# Patient Record
Sex: Female | Born: 2000 | Race: Black or African American | Hispanic: No | Marital: Single | Smoking: Never smoker
Health system: Southern US, Community
[De-identification: ages and names within clinical notes are randomized; demographics above are authoritative.]

## PROBLEM LIST (undated history)

## (undated) DIAGNOSIS — D573 Sickle-cell trait: Secondary | ICD-10-CM

## (undated) DIAGNOSIS — E119 Type 2 diabetes mellitus without complications: Secondary | ICD-10-CM

## (undated) DIAGNOSIS — N390 Urinary tract infection, site not specified: Secondary | ICD-10-CM

## (undated) DIAGNOSIS — E1110 Type 2 diabetes mellitus with ketoacidosis without coma: Secondary | ICD-10-CM

## (undated) HISTORY — PX: ADENOIDECTOMY: SUR15

## (undated) HISTORY — PX: MYRINGOTOMY: SHX2060

## (undated) HISTORY — PX: TONSILLECTOMY: SUR1361

---
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
## 2022-04-24 ENCOUNTER — Other Ambulatory Visit: Payer: Self-pay

## 2022-04-24 ENCOUNTER — Emergency Department
Admission: EM | Admit: 2022-04-24 | Discharge: 2022-04-24 | Disposition: A | Discharge status: Home / Self Care | Payer: Medicaid Other | Attending: Emergency Medicine | Admitting: Emergency Medicine

## 2022-04-24 ENCOUNTER — Emergency Department: Payer: Medicaid Other

## 2022-04-24 DIAGNOSIS — E876 Hypokalemia: Secondary | ICD-10-CM | POA: Insufficient documentation

## 2022-04-24 DIAGNOSIS — R112 Nausea with vomiting, unspecified: Secondary | ICD-10-CM | POA: Diagnosis not present

## 2022-04-24 DIAGNOSIS — R102 Pelvic and perineal pain: Secondary | ICD-10-CM

## 2022-04-24 DIAGNOSIS — E7132 Disorders of ketone metabolism: Secondary | ICD-10-CM | POA: Diagnosis not present

## 2022-04-24 DIAGNOSIS — R824 Acetonuria: Secondary | ICD-10-CM | POA: Diagnosis not present

## 2022-04-24 DIAGNOSIS — R809 Proteinuria, unspecified: Secondary | ICD-10-CM | POA: Diagnosis not present

## 2022-04-24 DIAGNOSIS — R1030 Lower abdominal pain, unspecified: Secondary | ICD-10-CM | POA: Diagnosis present

## 2022-04-24 LAB — LIPASE, BLOOD: Lipase: 28 U/L (ref 11–51)

## 2022-04-24 LAB — BLOOD GAS, VENOUS
Acid-Base Excess: 0.3 mmol/L (ref 0.0–2.0)
Bicarbonate: 25.4 mmol/L (ref 20.0–28.0)
O2 Saturation: 83.5 %
Patient temperature: 37
pCO2, Ven: 42 mmHg — ABNORMAL LOW (ref 44–60)
pH, Ven: 7.39 (ref 7.25–7.43)
pO2, Ven: 54 mmHg — ABNORMAL HIGH (ref 32–45)

## 2022-04-24 LAB — COMPREHENSIVE METABOLIC PANEL
ALT: 19 U/L (ref 0–44)
AST: 23 U/L (ref 15–41)
Albumin: 4.6 g/dL (ref 3.5–5.0)
Alkaline Phosphatase: 85 U/L (ref 38–126)
Anion gap: 14 (ref 5–15)
BUN: 18 mg/dL (ref 6–20)
CO2: 18 mmol/L — ABNORMAL LOW (ref 22–32)
Calcium: 10.1 mg/dL (ref 8.9–10.3)
Chloride: 103 mmol/L (ref 98–111)
Creatinine, Ser: 0.53 mg/dL (ref 0.44–1.00)
GFR, Estimated: >60 mL/min (ref >60)
Glucose, Bld: 330 mg/dL — ABNORMAL HIGH (ref 70–99)
Potassium: 3.1 mmol/L — ABNORMAL LOW (ref 3.5–5.1)
Sodium: 135 mmol/L (ref 135–145)
Total Bilirubin: 1 mg/dL (ref 0.3–1.2)
Total Protein: 9.1 g/dL — ABNORMAL HIGH (ref 6.5–8.1)

## 2022-04-24 LAB — URINALYSIS, ROUTINE W REFLEX MICROSCOPIC
Bilirubin Urine: NEGATIVE
Glucose, UA: >=500 mg/dL — AB
Ketones, ur: 80 mg/dL — AB
Nitrite: NEGATIVE
Protein, ur: 30 mg/dL — AB
Specific Gravity, Urine: 1.024 (ref 1.005–1.030)
pH: 9 — ABNORMAL HIGH (ref 5.0–8.0)

## 2022-04-24 LAB — CBC
HCT: 36.5 % (ref 36.0–46.0)
Hemoglobin: 11 g/dL — ABNORMAL LOW (ref 12.0–15.0)
MCH: 19 pg — ABNORMAL LOW (ref 26.0–34.0)
MCHC: 30.1 g/dL (ref 30.0–36.0)
MCV: 63.1 fL — ABNORMAL LOW (ref 80.0–100.0)
Platelets: 449 10*3/uL — ABNORMAL HIGH (ref 150–400)
RBC: 5.78 MIL/uL — ABNORMAL HIGH (ref 3.87–5.11)
RDW: 18.5 % — ABNORMAL HIGH (ref 11.5–15.5)
WBC: 10.2 10*3/uL (ref 4.0–10.5)
nRBC: 0 % (ref 0.0–0.2)

## 2022-04-24 LAB — LACTIC ACID, PLASMA: Lactic Acid, Venous: 0.9 mmol/L (ref 0.5–1.9)

## 2022-04-24 LAB — BETA-HYDROXYBUTYRIC ACID: Beta-Hydroxybutyric Acid: 1.82 mmol/L — ABNORMAL HIGH (ref 0.05–0.27)

## 2022-04-24 LAB — POC URINE PREG, ED: Preg Test, Ur: NEGATIVE

## 2022-04-24 LAB — CBG MONITORING, ED: Glucose-Capillary: 122 mg/dL — ABNORMAL HIGH (ref 70–99)

## 2022-04-24 LAB — MAGNESIUM: Magnesium: 1.7 mg/dL (ref 1.7–2.4)

## 2022-04-24 MED ORDER — SODIUM CHLORIDE 0.9 % IV BOLUS
1000.0000 mL | Freq: Once | INTRAVENOUS | Status: AC
  Administered 2022-04-24: 1000 mL via INTRAVENOUS

## 2022-04-24 MED ORDER — POTASSIUM CHLORIDE CRYS ER 20 MEQ PO TBCR: 40.0000 meq | EXTENDED_RELEASE_TABLET | Freq: Once | ORAL | Status: DC

## 2022-04-24 MED ORDER — ONDANSETRON 4 MG PO TBDP
4.0000 mg | ORAL_TABLET | Freq: Once | ORAL | Status: AC | PRN
Start: 2022-04-24 — End: 2022-04-24
  Administered 2022-04-24: 4 mg via ORAL
  Filled 2022-04-24: qty 1

## 2022-04-24 MED ORDER — HYDROMORPHONE HCL 1 MG/ML IJ SOLN
1.0000 mg | Freq: Once | INTRAMUSCULAR | Status: AC
  Administered 2022-04-24: 1 mg via INTRAVENOUS
  Filled 2022-04-24: qty 1

## 2022-04-24 NOTE — ED Provider Notes (Signed)
[facility] Provider Note    Event Date/Time   First MD Initiated Contact with Patient 04/24/22 1714     (approximate)   History   Chief Complaint Abdominal Cramping   HPI Krista Gill is a 21 y.o. female, history of type 1 diabetes, DKA, presents emergency department for evaluation of abdominal cramping.  She states that she started her period approximately 3 hours ago has been having lower abdominal cramps, that are reportedly triggering her to vomit.  She states that this is happened before with her menstrual cycle cramps, but never this bad.  She states that she has some vaginal bleeding, though it is the normal amount that she usually gets with her periods. She additionally has a history of DKA and was concerned about her blood sugar.  She currently takes [place].  She states that she has not missed any dosages.  Denies fever/chills, vaginal discharge, chest pain, shortness of breath, diarrhea, rash/lesions, numbness/tingling upper or lower extremities, dysuria, or dizziness/lightheadedness.  Denies any concern for STDs.  History Limitations: No limitations.        Physical Exam  Triage Vital Signs: ED Triage Vitals  Enc Vitals Group     BP 04/24/22 1618 122/86     Pulse Rate 04/24/22 1618 84     Resp 04/24/22 1618 16     Temp 04/24/22 1618 97.9 F (36.6 C)     Temp Source 04/24/22 1618 Oral     SpO2 04/24/22 1618 100 %     Weight 04/24/22 1621 150 lb (68 kg)     Height 04/24/22 1621 4\' 11"  (1.499 m)     Head Circumference --      Peak Flow --      Pain Score 04/24/22 1620 7     Pain Loc --      Pain Edu? --      Excl. in GC? --     Most recent vital signs: Vitals:   04/24/22 1618  BP: 122/86  Pulse: 84  Resp: 16  Temp: 97.9 F (36.6 C)  SpO2: 100%    General: Awake, NAD.  Skin: Warm, dry. No rashes or lesions.  Eyes: PERRL. Conjunctivae normal.  CV: Good peripheral perfusion.  Resp: Normal effort.  Abd: Soft,  non-tender. No distention.  Neuro: At baseline. No gross neurological deficits.   Focused Exam: Mild tenderness diffusely across the lower abdomen.  No obvious adnexal masses.  Physical Exam    ED Results / Procedures / Treatments  Labs (all labs ordered are listed, but only abnormal results are displayed) Labs Reviewed  COMPREHENSIVE METABOLIC PANEL - Abnormal; Notable for the following components:      Result Value   Potassium 3.1 (*)    CO2 18 (*)    Glucose, Bld 330 (*)    Total Protein 9.1 (*)    All other components within normal limits  CBC - Abnormal; Notable for the following components:   RBC 5.78 (*)    Hemoglobin 11.0 (*)    MCV 63.1 (*)    MCH 19.0 (*)    RDW 18.5 (*)    Platelets 449 (*)    All other components within normal limits  URINALYSIS, ROUTINE W REFLEX MICROSCOPIC - Abnormal; Notable for the following components:   Color, Urine YELLOW (*)    APPearance CLEAR (*)    pH 9.0 (*)    Glucose, UA >=500 (*)    Hgb urine dipstick LARGE (*)  Ketones, ur 80 (*)    Protein, ur 30 (*)    Leukocytes,Ua SMALL (*)    Bacteria, UA RARE (*)    All other components within normal limits  BETA-HYDROXYBUTYRIC ACID - Abnormal; Notable for the following components:   Beta-Hydroxybutyric Acid 1.82 (*)    All other components within normal limits  LIPASE, BLOOD  MAGNESIUM  LACTIC ACID, PLASMA  BLOOD GAS, VENOUS  LACTIC ACID, PLASMA  POC URINE PREG, ED     EKG N/A.   RADIOLOGY  ED Provider Interpretation: I personally viewed and interpreted this ultrasound.  No evidence of any acute findings.  US PELVIC COMPLETE W TRANSVAGINAL AND TORSION R/O  Result Date: 04/24/2022 CLINICAL DATA:  Pelvic pain for several hours EXAM: TRANSABDOMINAL AND TRANSVAGINAL ULTRASOUND OF PELVIS DOPPLER ULTRASOUND OF OVARIES TECHNIQUE: Both transabdominal and transvaginal ultrasound examinations of the pelvis were performed. Transabdominal technique was performed for global imaging  of the pelvis including uterus, ovaries, adnexal regions, and pelvic cul-de-sac. It was necessary to proceed with endovaginal exam following the transabdominal exam to visualize the ovaries. Color and duplex Doppler ultrasound was utilized to evaluate blood flow to the ovaries. COMPARISON:  None Available. FINDINGS: Uterus Measurements: 7.1 x 3.3 x 4.2 cm. = volume: 51 mL. No fibroids or other mass visualized. Endometrium Thickness: 4.4 mm.  No focal abnormality visualized. Right ovary Measurements: 3.9 x 2.0 x 1.9 cm. = volume: 7.6 mL. Normal appearance/no adnexal mass. Left ovary Measurements: 3.6 x 2.0 x 3.3 cm. = volume: 12.5 mL. Normal appearance/no adnexal mass. Pulsed Doppler evaluation of both ovaries demonstrates normal low-resistance arterial and venous waveforms. Other findings Minimal free fluid is noted likely physiologic in nature. IMPRESSION: Unremarkable pelvic ultrasound.  No ovarian torsion is seen. Electronically Signed   By: Alcide Clever M.D.   On: 04/24/2022 19:58    PROCEDURES:  Critical Care performed: N/A.  Procedures    MEDICATIONS ORDERED IN ED: Medications  potassium chloride SA (KLOR-CON M) CR tablet 40 mEq (0 mEq Oral Hold 04/24/22 1800)  ondansetron (ZOFRAN-ODT) disintegrating tablet 4 mg (4 mg Oral Given 04/24/22 1629)  sodium chloride 0.9 % bolus 1,000 mL (0 mLs Intravenous Stopped 04/24/22 2001)  HYDROmorphone (DILAUDID) injection 1 mg (1 mg Intravenous Given 04/24/22 1801)     IMPRESSION / MDM / ASSESSMENT AND PLAN / ED COURSE  I reviewed the triage vital signs and the nursing notes.                              Differential diagnosis includes, but is not limited to, cystitis, pyelonephritis, menstrual cramps, ovarian cyst, ovarian torsion, DKA  ED Course Patient appears clinically well, but uncomfortable.  Vitals are within normal limits.  We will go ahead treat with IV fluids, ondansetron, and hydromorphone.  CBC shows no leukocytosis or clinically  significant anemia.  CMP shows hypokalemia at 3.1.  We will provide potassium supplementation.  Additionally shows elevated glucose at 330.  No transaminitis.  Lipase unremarkable at 28.  Unlikely pancreatitis.  Beta hydroxybutyrate acid elevated at 1.82.  Urinalysis shows elevated glucose, large hemoglobin, ketonuria, and proteinuria.  No significant signs of infection.  Magnesium level unremarkable at 1.7.   Assessment/Plan Patient presents with lower abdominal cramping with associated nausea/vomiting.  Physical exam is unimpressive.  Clinically she appears well.  Ultrasound shows no evidence of ovarian cyst/torsion.  Her lab work-up is notable for elevated beta hydroxybutyric acid at 1.82, serum glucose  of 330.  Symptoms suggestive of possible DKA.  Will get venous blood gas to evaluate further.  Patient care transferred over to [facility] would PA-C at 2045.    FINAL CLINICAL IMPRESSION(S) / ED DIAGNOSES   Final diagnoses:  None     Rx / DC Orders   ED Discharge Orders     None        Note:  This document was prepared using Dragon voice recognition software and may include unintentional dictation errors.   Varney Daily, [place] 04/24/22 2046    Concha Se, MD 04/25/22 1143

## 2022-04-24 NOTE — ED Triage Notes (Addendum)
Pt reports that she started her period 3 hours ago and having cramps and states that she thinks the cramps are triggering her to vomit, pt states that this has [place], states that when she checked her blood sugar today it was 206, pt states that when the fire dept checked her blood sugar it was 360, but thinks that ems wrote it down as 306

## 2022-04-24 NOTE — Discharge Instructions (Signed)
Please make follow-up appointment with Dr. Jackson. 

## 2022-04-24 NOTE — ED Notes (Signed)
This RN to bedside for blood redraw, updated pt. And mother on POC.

## 2022-04-24 NOTE — ED Provider Notes (Signed)
  Physical Exam  BP 108/69 (BP Location: Right Arm)   Pulse 84   Temp 98.2 F (36.8 C) (Oral)   Resp 16   Ht 4\' 11"  (1.499 m)   Wt 68 kg   SpO2 98%   BMI 30.30 kg/m   Physical Exam  Procedures  Procedures  ED Course / MDM   Clinical Course as of 04/24/22 2150  Wed Apr 24, 2022  2038 Beta-Hydroxybutyric Acid(!): 1.82 [JW]    Clinical Course User Index [JW] Apr 26, 2022, PA-C   Medical Decision Making Amount and/or Complexity of Data Reviewed Labs: ordered. Decision-making details documented in ED Course. Radiology: ordered.  Risk Prescription drug management.    Assumed patient care from [facility], PA-C.  Patient's repeat CBG was 122.  Upon recheck, patient was resting comfortably and stated that she had no pelvic pain and no nausea/vomiting.  VBG returned with reassuring venous pH with only mildly decreased PCO2.  Patient states that she feels well enough to be discharged and did request gynecology referral.  Referral to Dr. [facility] [place] was included in patient's discharge paperwork.     Jean Rosenthal Momeyer, [place] 04/24/22 2151    2152, MD 04/24/22 2252

## 2022-04-24 NOTE — ED Triage Notes (Signed)
First Nurse Note:  Pt via EMS from home. Pt c/o menstrual cramps that is unchanged from her normal cramps. States her cycle started today. Pt is A&Ox4 and NAD  88 HR 126/77 98% on RA 306 CBG with hx

## 2022-04-24 NOTE — ED Notes (Signed)
This RN to bedside for blood draw, updated mother and pt. On POC.

## 2022-05-15 ENCOUNTER — Other Ambulatory Visit: Payer: Self-pay

## 2022-05-15 ENCOUNTER — Encounter: Payer: Self-pay | Admitting: Emergency Medicine

## 2022-05-15 ENCOUNTER — Emergency Department
Admission: EM | Admit: 2022-05-15 | Discharge: 2022-05-15 | Disposition: A | Discharge status: Home / Self Care | Payer: Medicaid Other | Attending: Emergency Medicine | Admitting: Emergency Medicine

## 2022-05-15 DIAGNOSIS — W1839XA Other fall on same level, initial encounter: Secondary | ICD-10-CM | POA: Diagnosis not present

## 2022-05-15 DIAGNOSIS — S51812A Laceration without foreign body of left forearm, initial encounter: Secondary | ICD-10-CM | POA: Insufficient documentation

## 2022-05-15 DIAGNOSIS — E109 Type 1 diabetes mellitus without complications: Secondary | ICD-10-CM | POA: Diagnosis not present

## 2022-05-15 DIAGNOSIS — S59912A Unspecified injury of left forearm, initial encounter: Secondary | ICD-10-CM | POA: Diagnosis present

## 2022-05-15 MED ORDER — CEPHALEXIN 500 MG PO CAPS: 1000.0000 mg | ORAL_CAPSULE | Freq: Two times a day (BID) | ORAL | 0 refills | Status: AC

## 2022-05-15 MED ORDER — LIDOCAINE-EPINEPHRINE 2 %-1:200000 IJ SOLN
10.0000 mL | Freq: Once | INTRAMUSCULAR | Status: AC
Start: 2022-05-15 — End: 2022-05-15
  Administered 2022-05-15: 10 mL
  Filled 2022-05-15: qty 20

## 2022-05-15 NOTE — Discharge Instructions (Addendum)
-  Please return to the emergency department or urgent care for suture removal in 7 to 10 days.  -Take all of your antibiotics as prescribed.  -Recommend applying a daily antibiotic ointment to the suture site daily.  You do not have to keep it covered, however cover is recommended if you plan on being outside.  -Avoid any exertional activities with that arm to prevent suture rupture.  -Return to the emergency department anytime if you begin to experience any new or worsening symptoms.

## 2022-05-15 NOTE — ED Provider Triage Note (Signed)
Emergency Medicine Provider Triage Evaluation Note  ROSMARIE ESQUIBEL , a 21 y.o. female  was evaluated in triage.  Pt complains of laceration to the left forearm.  Tdap up-to-date.  Review of Systems  Positive: Laceration left forearm Negative: Numbness tingling  Physical Exam  BP 114/84 (BP Location: Left Arm)   Pulse (!) 105   Resp 16   Ht 4\' 11"  (1.499 m)   Wt 72.6 kg   SpO2 97%   BMI 32.32 kg/m  Gen:   Awake, no distress   Resp:  Normal effort  MSK:   Moves extremities without difficulty, 6 inch laceration noted to the left forearm Other:    Medical Decision Making  Medically screening exam initiated at 12:23 PM.  Appropriate orders placed.  BECKEY POLKOWSKI was informed that the remainder of the evaluation will be completed by another provider, this initial triage assessment does not replace that evaluation, and the importance of remaining in the ED until their evaluation is complete.     Irwin Brakeman, PA-C 05/15/22 1223

## 2022-05-15 NOTE — ED Notes (Signed)
Lac to left arm.  Larey Seat outside.  On concreted.  No other injury.  Says she fell because either she was going to fast or what.

## 2022-05-15 NOTE — ED Provider Notes (Signed)
[facility] Provider Note    Event Date/Time   First MD Initiated Contact with Patient 05/15/22 1244     (approximate)   History   Chief Complaint Laceration   HPI Krista Gill is a 21 y.o. female, history of type 1 diabetes, sickle cell trait, presents to the emergency department for evaluation of forearm laceration.  Patient states that she fell down and sustained a laceration on her left forearm somehow.  She is unsure exactly what had cut it.  Denies any other injuries or illnesses.  Denies any head injury or LOC.  She washed it out immediately with cold water.  Denies cold sensation in the affected extremity, numbness/tingling in the affected extremity, chest pain, shortness of breath, fever/chills, myalgias, nausea/vomiting, or dizziness/lightheadedness.  History Limitations: No limitations.        Physical Exam  Triage Vital Signs: ED Triage Vitals  Enc Vitals Group     BP 05/15/22 1222 114/84     Pulse Rate 05/15/22 1222 (!) 105     Resp 05/15/22 1222 16     Temp 05/15/22 1223 98.4 F (36.9 C)     Temp Source 05/15/22 1222 Oral     SpO2 05/15/22 1222 97 %     Weight 05/15/22 1222 160 lb (72.6 kg)     Height 05/15/22 1222 4\' 11"  (1.499 m)     Head Circumference --      Peak Flow --      Pain Score 05/15/22 1222 3     Pain Loc --      Pain Edu? --      Excl. in GC? --     Most recent vital signs: Vitals:   05/15/22 1223 05/15/22 1434  BP:  114/80  Pulse:  99  Resp:  16  Temp: 98.4 F (36.9 C) 98.4 F (36.9 C)  SpO2:  98%    General: Awake, NAD.  Skin: Warm, dry. No rashes or lesions.  Eyes: PERRL. Conjunctivae normal.  CV: Good peripheral perfusion.  Resp: Normal effort.  Abd: Soft, non-tender. No distention.  Neuro: At baseline. No gross neurological deficits.  Musculoskeletal: Normal ROM of all extremities.   Focused Exam: 8 cm linear laceration across the left forearm with exposed subcutaneous tissue.  No active  bleeding or discharge.  No foreign bodies.  Normal range of motion of the arm and hand/fingers distally.  Sensation intact distally.  No surrounding warmth, erythema, or tenderness.  No underlying osseous tenderness.  Physical Exam    ED Results / Procedures / Treatments  Labs (all labs ordered are listed, but only abnormal results are displayed) Labs Reviewed - No data to display   EKG N/A.   RADIOLOGY  ED Provider Interpretation: N/A.  No results found.  PROCEDURES:  Critical Care performed: N/A.  07/15/22.Laceration Repair  Date/Time: 05/15/2022 3:30 PM  Performed by: 07/15/2022, PA Authorized by: Varney Daily, PA   Consent:    Consent obtained:  Verbal   Consent given by:  Patient   Risks, benefits, and alternatives were discussed: yes     Risks discussed:  Infection, pain, retained foreign body, vascular damage and tendon damage   Alternatives discussed:  No treatment Universal protocol:    Patient identity confirmed:  Verbally with patient Anesthesia:    Anesthesia method:  Local infiltration   Local anesthetic:  Lidocaine 2% WITH epi Laceration details:    Location:  Shoulder/arm   Shoulder/arm location:  L  lower arm   Length (cm):  8   Depth (mm):  3 Pre-procedure details:    Preparation:  Patient was prepped and draped in usual sterile fashion Exploration:    Hemostasis achieved with:  Direct pressure   Wound exploration: wound explored through full range of motion and entire depth of wound visualized     Wound extent: no foreign bodies/material noted, no muscle damage noted, no nerve damage noted, no tendon damage noted, no underlying fracture noted and no vascular damage noted   Treatment:    Area cleansed with:  Saline   Amount of cleaning:  Extensive   Irrigation solution:  Sterile saline   Irrigation volume:  1000 ml   Irrigation method:  Pressure wash   Debridement:  None Skin repair:    Repair method:  Sutures   Suture size:   4-0   Suture material:  Nylon   Suture technique:  Simple interrupted and running   Number of sutures:  20 Approximation:    Approximation:  Close Repair type:    Repair type:  Simple Post-procedure details:    Dressing:  Sterile dressing and tube gauze   Procedure completion:  Tolerated well, no immediate complications     MEDICATIONS ORDERED IN ED: Medications  lidocaine-EPINEPHrine (XYLOCAINE W/EPI) 2 %-1:200000 (PF) injection 10 mL (10 mLs Infiltration Given 05/15/22 1400)     IMPRESSION / MDM / ASSESSMENT AND PLAN / ED COURSE  I reviewed the triage vital signs and the nursing notes.                              Differential diagnosis includes, but is not limited to, laceration, foreign body, cellulitis, tendon injury.   Assessment/Plan Patient presents with a 8 cm linear vertical laceration across the left forearm.  No active bleeding or discharge at this time.  She is up-to-date on her tetanus.  No signs of any underlying tendon injury or osseous injury.  Was able to repair with a combination of simple running and simple interrupted sutures.  See above for details.  She tolerated the procedure well with no immediate complications.  Given the nature of the injury, will provide her with antimicrobial prophylaxis with prescription for cephalexin.  Instructed her to return to the emergency department 7 to 10 days for suture removal.  She expressed understanding and agreed.  With discharge.  Provided the patient with anticipatory guidance, return precautions, and educational material. Encouraged the patient to return to the emergency department at any time if they begin to experience any new or worsening symptoms. Patient expressed understanding and agreed with the plan.   Patient's presentation is most consistent with acute, uncomplicated illness.       FINAL CLINICAL IMPRESSION(S) / ED DIAGNOSES   Final diagnoses:  Laceration of left forearm, initial encounter     Rx /  DC Orders   ED Discharge Orders          Ordered    cephALEXin (KEFLEX) 500 MG capsule  2 times daily        05/15/22 1423             Note:  This document was prepared using Dragon voice recognition software and may include unintentional dictation errors.   Varney Daily, [place] 05/15/22 1532    Concha Se, MD 05/16/22 [phone]

## 2022-05-15 NOTE — ED Triage Notes (Signed)
Pt to ED via POV for laceration on her left forearm. Pt states that she is unsure how it happened. Bleeding is controlled at this time.

## 2022-05-15 NOTE — ED Notes (Signed)
Dc instructions reviewed with pt no questions or concerns at this time. Will have sutures removed in 7-10 days

## 2022-05-22 ENCOUNTER — Emergency Department
Admission: EM | Admit: 2022-05-22 | Discharge: 2022-05-22 | Disposition: A | Discharge status: Home / Self Care | Payer: Medicaid Other | Attending: Emergency Medicine | Admitting: Emergency Medicine

## 2022-05-22 ENCOUNTER — Other Ambulatory Visit: Payer: Self-pay

## 2022-05-22 ENCOUNTER — Encounter: Payer: Self-pay | Admitting: *Deleted

## 2022-05-22 DIAGNOSIS — Z4802 Encounter for removal of sutures: Secondary | ICD-10-CM | POA: Diagnosis present

## 2022-05-22 NOTE — ED Triage Notes (Signed)
Pt here for suture removal of left forearm.  Pt has some redness and pain.  No drainage.  Pt alert  speech clear.

## 2022-05-22 NOTE — ED Provider Notes (Signed)
[facility] Provider Note  Patient Contact: 10:53 PM (approximate)   History   Suture / Staple Removal   HPI  Krista Gill is a 21 y.o. female presents to the emergency department for suture removal.  Patient had sutures placed on 9/6.  Patient denies wound concerns.  No fever or chills.     Physical Exam   Triage Vital Signs: ED Triage Vitals  Enc Vitals Group     BP 05/22/22 2055 121/85     Pulse Rate 05/22/22 2055 (!) 110     Resp 05/22/22 2055 16     Temp 05/22/22 2055 98.4 F (36.9 C)     Temp Source 05/22/22 2055 Oral     SpO2 05/22/22 2055 92 %     Weight 05/22/22 2053 153 lb (69.4 kg)     Height 05/22/22 2053 4\' 11"  (1.499 m)     Head Circumference --      Peak Flow --      Pain Score 05/22/22 2053 5     Pain Loc --      Pain Edu? --      Excl. in GC? --     Most recent vital signs: Vitals:   05/22/22 2055  BP: 121/85  Pulse: (!) 110  Resp: 16  Temp: 98.4 F (36.9 C)  SpO2: 92%     General: Alert and in no acute distress. Eyes:  PERRL. EOMI. Head: No acute traumatic findings ENT:      Nose: No congestion/rhinnorhea.      Mouth/Throat: Mucous membranes are moist. Neck: No stridor. No cervical spine tenderness to palpation. Cardiovascular:  Good peripheral perfusion Respiratory: Normal respiratory effort without tachypnea or retractions. Lungs CTAB. Good air entry to the bases with no decreased or absent breath sounds. Gastrointestinal: Bowel sounds 4 quadrants. Soft and nontender to palpation. No guarding or rigidity. No palpable masses. No distention. No CVA tenderness. Musculoskeletal: Full range of motion to all extremities.  Neurologic:  No gross focal neurologic deficits are appreciated.  Skin: Patient has 20 sutures of left forearm. Other:   ED Results / Procedures / Treatments   Labs (all labs ordered are listed, but only abnormal results are displayed) Labs Reviewed - No data to  display      PROCEDURES:  Critical Care performed: No  .Suture Removal  Date/Time: 05/22/2022 10:58 PM  Performed by: 05/24/2022, PA-C Authorized by: Orvil Feil, PA-C   Consent:    Consent obtained:  Verbal   Risks discussed:  Bleeding and pain Universal protocol:    Procedure explained and questions answered to patient or proxy's satisfaction: yes     Patient identity confirmed:  Verbally with patient Location:    Location:  Upper extremity   Upper extremity location:  Arm   Arm location:  L lower arm Procedure details:    Wound appearance:  No signs of infection   Number of sutures removed:  20 Post-procedure details:    Post-removal:  Steri-Strips applied   Procedure completion:  Tolerated well, no immediate complications    MEDICATIONS ORDERED IN ED: Medications - No data to display   IMPRESSION / MDM / ASSESSMENT AND PLAN / ED COURSE  I reviewed the triage vital signs and the nursing notes.                              Assessment and plan Visit for  suture removal 21 year old female presents to the emergency department for suture removal.  Sutures were removed without complication.  All patient questions were answered.     FINAL CLINICAL IMPRESSION(S) / ED DIAGNOSES   Final diagnoses:  Visit for suture removal     Rx / DC Orders   ED Discharge Orders     None        Note:  This document was prepared using Dragon voice recognition software and may include unintentional dictation errors.   Pia Mau [place], PA-C 05/22/22 2259    Minna Antis, MD 05/22/22 2330

## 2022-07-24 ENCOUNTER — Inpatient Hospital Stay: Payer: Medicaid Other

## 2022-07-24 ENCOUNTER — Other Ambulatory Visit: Payer: Self-pay

## 2022-07-24 ENCOUNTER — Inpatient Hospital Stay
Admission: EM | Admit: 2022-07-24 | Discharge: 2022-07-26 | DRG: 639 | Disposition: A | Discharge status: Home / Self Care | Payer: Medicaid Other | Attending: Internal Medicine | Admitting: Internal Medicine

## 2022-07-24 DIAGNOSIS — D573 Sickle-cell trait: Secondary | ICD-10-CM | POA: Diagnosis present

## 2022-07-24 DIAGNOSIS — E1110 Type 2 diabetes mellitus with ketoacidosis without coma: Secondary | ICD-10-CM | POA: Diagnosis not present

## 2022-07-24 DIAGNOSIS — E876 Hypokalemia: Secondary | ICD-10-CM | POA: Diagnosis present

## 2022-07-24 DIAGNOSIS — Z91018 Allergy to other foods: Secondary | ICD-10-CM | POA: Diagnosis not present

## 2022-07-24 DIAGNOSIS — Z794 Long term (current) use of insulin: Secondary | ICD-10-CM | POA: Diagnosis not present

## 2022-07-24 DIAGNOSIS — Z832 Family history of diseases of the blood and blood-forming organs and certain disorders involving the immune mechanism: Secondary | ICD-10-CM

## 2022-07-24 DIAGNOSIS — E1010 Type 1 diabetes mellitus with ketoacidosis without coma: Secondary | ICD-10-CM | POA: Diagnosis present

## 2022-07-24 LAB — URINALYSIS, ROUTINE W REFLEX MICROSCOPIC
Bacteria, UA: NONE SEEN
Bilirubin Urine: NEGATIVE
Glucose, UA: >=500 mg/dL — AB
Hgb urine dipstick: NEGATIVE
Ketones, ur: 80 mg/dL — AB
Leukocytes,Ua: NEGATIVE
Nitrite: NEGATIVE
Protein, ur: >=300 mg/dL — AB
Specific Gravity, Urine: 1.017 (ref 1.005–1.030)
pH: 5 (ref 5.0–8.0)

## 2022-07-24 LAB — CBC
HCT: 44.5 % (ref 36.0–46.0)
Hemoglobin: 13 g/dL (ref 12.0–15.0)
MCH: 19.9 pg — ABNORMAL LOW (ref 26.0–34.0)
MCHC: 29.2 g/dL — ABNORMAL LOW (ref 30.0–36.0)
MCV: 68.1 fL — ABNORMAL LOW (ref 80.0–100.0)
Platelets: 604 10*3/uL — ABNORMAL HIGH (ref 150–400)
RBC: 6.53 MIL/uL — ABNORMAL HIGH (ref 3.87–5.11)
RDW: 21.9 % — ABNORMAL HIGH (ref 11.5–15.5)
WBC: 18.8 10*3/uL — ABNORMAL HIGH (ref 4.0–10.5)
nRBC: 0 % (ref 0.0–0.2)

## 2022-07-24 LAB — COMPREHENSIVE METABOLIC PANEL
ALT: 18 U/L (ref 0–44)
AST: 23 U/L (ref 15–41)
Albumin: 5.2 g/dL — ABNORMAL HIGH (ref 3.5–5.0)
Alkaline Phosphatase: 104 U/L (ref 38–126)
BUN: 11 mg/dL (ref 6–20)
CO2: <7 mmol/L — ABNORMAL LOW (ref 22–32)
Calcium: 9.5 mg/dL (ref 8.9–10.3)
Chloride: 112 mmol/L — ABNORMAL HIGH (ref 98–111)
Creatinine, Ser: 0.68 mg/dL (ref 0.44–1.00)
GFR, Estimated: >60 mL/min (ref >60)
Glucose, Bld: 302 mg/dL — ABNORMAL HIGH (ref 70–99)
Potassium: 4.7 mmol/L (ref 3.5–5.1)
Sodium: 140 mmol/L (ref 135–145)
Total Bilirubin: 1.5 mg/dL — ABNORMAL HIGH (ref 0.3–1.2)
Total Protein: 10.4 g/dL — ABNORMAL HIGH (ref 6.5–8.1)

## 2022-07-24 LAB — CBG MONITORING, ED
Glucose-Capillary: 315 mg/dL — ABNORMAL HIGH (ref 70–99)
Glucose-Capillary: 271 mg/dL — ABNORMAL HIGH (ref 70–99)
Glucose-Capillary: 272 mg/dL — ABNORMAL HIGH (ref 70–99)
Glucose-Capillary: 215 mg/dL — ABNORMAL HIGH (ref 70–99)
Glucose-Capillary: 181 mg/dL — ABNORMAL HIGH (ref 70–99)
Glucose-Capillary: 146 mg/dL — ABNORMAL HIGH (ref 70–99)

## 2022-07-24 LAB — RETICULOCYTES
Immature Retic Fract: 34.7 % — ABNORMAL HIGH (ref 2.3–15.9)
RBC.: 6.57 MIL/uL — ABNORMAL HIGH (ref 3.87–5.11)
Retic Count, Absolute: 162.9 10*3/uL (ref 19.0–186.0)
Retic Ct Pct: 2.5 % (ref 0.4–3.1)

## 2022-07-24 LAB — PHOSPHORUS: Phosphorus: 2.4 mg/dL — ABNORMAL LOW (ref 2.5–4.6)

## 2022-07-24 LAB — POC URINE PREG, ED: Preg Test, Ur: NEGATIVE

## 2022-07-24 LAB — BETA-HYDROXYBUTYRIC ACID: Beta-Hydroxybutyric Acid: >8 mmol/L — ABNORMAL HIGH (ref 0.05–0.27)

## 2022-07-24 LAB — MAGNESIUM: Magnesium: 1.1 mg/dL — ABNORMAL LOW (ref 1.7–2.4)

## 2022-07-24 LAB — LIPASE, BLOOD: Lipase: 25 U/L (ref 11–51)

## 2022-07-24 MED ORDER — SODIUM CHLORIDE 0.9 % IV BOLUS
1000.0000 mL | Freq: Once | INTRAVENOUS | Status: AC
  Administered 2022-07-24: 1000 mL via INTRAVENOUS

## 2022-07-24 MED ORDER — DEXTROSE 50 % IV SOLN: 0.0000 mL | INTRAVENOUS | Status: DC | PRN

## 2022-07-24 MED ORDER — LACTATED RINGERS IV BOLUS: 20.0000 mL/kg | Freq: Once | INTRAVENOUS | Status: DC

## 2022-07-24 MED ORDER — INSULIN REGULAR(HUMAN) IN NACL 100-0.9 UT/100ML-% IV SOLN
INTRAVENOUS | Status: DC
  Administered 2022-07-24: 1.7 [IU]/h via INTRAVENOUS
  Filled 2022-07-24: qty 100

## 2022-07-24 MED ORDER — DEXTROSE IN LACTATED RINGERS 5 % IV SOLN: INTRAVENOUS | Status: DC

## 2022-07-24 MED ORDER — MORPHINE SULFATE (PF) 4 MG/ML IV SOLN
4.0000 mg | Freq: Once | INTRAVENOUS | Status: AC
  Administered 2022-07-24: 4 mg via INTRAVENOUS
  Filled 2022-07-24: qty 1

## 2022-07-24 MED ORDER — POTASSIUM CHLORIDE 10 MEQ/100ML IV SOLN
10.0000 meq | INTRAVENOUS | Status: AC
  Administered 2022-07-24 (×2): 10 meq via INTRAVENOUS
  Filled 2022-07-24 (×2): qty 100

## 2022-07-24 MED ORDER — LACTATED RINGERS IV SOLN: INTRAVENOUS | Status: DC

## 2022-07-24 MED ORDER — METOCLOPRAMIDE HCL 5 MG/ML IJ SOLN
10.0000 mg | Freq: Once | INTRAMUSCULAR | Status: AC
  Administered 2022-07-24: 10 mg via INTRAVENOUS
  Filled 2022-07-24: qty 2

## 2022-07-24 MED ORDER — POTASSIUM CHLORIDE 10 MEQ/100ML IV SOLN
10.0000 meq | INTRAVENOUS | Status: DC
  Administered 2022-07-24: 10 meq via INTRAVENOUS

## 2022-07-24 MED ORDER — INSULIN REGULAR(HUMAN) IN NACL 100-0.9 UT/100ML-% IV SOLN
INTRAVENOUS | Status: DC
  Administered 2022-07-24: 8.5 [IU]/h via INTRAVENOUS
  Filled 2022-07-24: qty 100

## 2022-07-24 MED ORDER — ONDANSETRON HCL 4 MG/2ML IJ SOLN
4.0000 mg | Freq: Once | INTRAMUSCULAR | Status: AC
  Administered 2022-07-24: 4 mg via INTRAVENOUS
  Filled 2022-07-24: qty 2

## 2022-07-24 NOTE — ED Provider Notes (Signed)
[facility] Provider Note   Event Date/Time   First MD Initiated Contact with Patient 07/24/22 1251     (approximate) History  Emesis  HPI Krista Gill is a 22 y.o. female with a stated past medical history of poorly controlled diabetes who presents for nausea/vomiting for the last 24 hours.  Patient states that she has been unable to keep any food down and is concerned as this is how she got into DKA in the past.  Patient denies any missed doses of her insulin and she is concerned as her glucose has been increasing over the day. ROS: Patient currently denies any vision changes, tinnitus, difficulty speaking, facial droop, sore throat, chest pain, shortness of breath, abdominal pain, diarrhea, dysuria, or weakness/numbness/paresthesias in any extremity   Physical Exam  Triage Vital Signs: ED Triage Vitals  Enc Vitals Group     BP 07/24/22 1245 (!) 116/96     Pulse Rate 07/24/22 1245 (!) 119     Resp 07/24/22 1245 20     Temp 07/24/22 1245 97.9 F (36.6 C)     Temp Source 07/24/22 1245 Axillary     SpO2 07/24/22 1245 97 %     Weight --      Height --      Head Circumference --      Peak Flow --      Pain Score 07/24/22 1217 7     Pain Loc --      Pain Edu? --      Excl. in GC? --    Most recent vital signs: Vitals:   07/25/22 2200 07/25/22 2324  BP: 112/78 108/73  Pulse: 75 84  Resp: 15 16  Temp:  98.2 F (36.8 C)  SpO2: 100% 100%   General: Awake, oriented x4. CV:  Good peripheral perfusion.  Resp:  Normal effort.  Abd:  No distention.  Other:  Teenage African-American female laying in bed in mild distress secondary to nausea ED Results / Procedures / Treatments  Labs (all labs ordered are listed, but only abnormal results are displayed) Labs Reviewed  COMPREHENSIVE METABOLIC PANEL - Abnormal; Notable for the following components:      Result Value   Chloride 112 (*)    CO2 <7 (*)    Glucose, Bld 302 (*)    Total Protein 10.4 (*)     Albumin 5.2 (*)    Total Bilirubin 1.5 (*)    All other components within normal limits  CBC - Abnormal; Notable for the following components:   WBC 18.8 (*)    RBC 6.53 (*)    MCV 68.1 (*)    MCH 19.9 (*)    MCHC 29.2 (*)    RDW 21.9 (*)    Platelets 604 (*)    All other components within normal limits  URINALYSIS, ROUTINE W REFLEX MICROSCOPIC - Abnormal; Notable for the following components:   Color, Urine STRAW (*)    APPearance CLEAR (*)    Glucose, UA >=500 (*)    Ketones, ur 80 (*)    Protein, ur >=300 (*)    All other components within normal limits  BLOOD GAS, VENOUS - Abnormal; Notable for the following components:   pH, Ven 7.03 (*)    pCO2, Ven 26 (*)    pO2, Ven 54 (*)    Bicarbonate 6.9 (*)    Acid-base deficit 22.6 (*)    All other components within normal limits  BETA-HYDROXYBUTYRIC ACID -  Abnormal; Notable for the following components:   Beta-Hydroxybutyric Acid >8.00 (*)    All other components within normal limits  MAGNESIUM - Abnormal; Notable for the following components:   Magnesium 1.1 (*)    All other components within normal limits  PHOSPHORUS - Abnormal; Notable for the following components:   Phosphorus 2.4 (*)    All other components within normal limits  RETICULOCYTES - Abnormal; Notable for the following components:   RBC. 6.57 (*)    Immature Retic Fract 34.7 (*)    All other components within normal limits  BETA-HYDROXYBUTYRIC ACID - Abnormal; Notable for the following components:   Beta-Hydroxybutyric Acid 6.58 (*)    All other components within normal limits  BETA-HYDROXYBUTYRIC ACID - Abnormal; Notable for the following components:   Beta-Hydroxybutyric Acid 2.78 (*)    All other components within normal limits  HEMOGLOBIN A1C - Abnormal; Notable for the following components:   Hgb A1c MFr Bld 11.6 (*)    All other components within normal limits  BASIC METABOLIC PANEL - Abnormal; Notable for the following components:   Potassium  3.4 (*)    Chloride 118 (*)    CO2 15 (*)    Glucose, Bld 174 (*)    All other components within normal limits  BASIC METABOLIC PANEL - Abnormal; Notable for the following components:   Potassium 5.2 (*)    Chloride 121 (*)    CO2 8 (*)    Glucose, Bld 220 (*)    Anion gap 16 (*)    All other components within normal limits  BASIC METABOLIC PANEL - Abnormal; Notable for the following components:   Potassium 3.0 (*)    Chloride 113 (*)    CO2 18 (*)    Glucose, Bld 192 (*)    All other components within normal limits  BETA-HYDROXYBUTYRIC ACID - Abnormal; Notable for the following components:   Beta-Hydroxybutyric Acid 1.03 (*)    All other components within normal limits  GLUCOSE, CAPILLARY - Abnormal; Notable for the following components:   Glucose-Capillary 209 (*)    All other components within normal limits  GLUCOSE, CAPILLARY - Abnormal; Notable for the following components:   Glucose-Capillary 175 (*)    All other components within normal limits  GLUCOSE, CAPILLARY - Abnormal; Notable for the following components:   Glucose-Capillary 171 (*)    All other components within normal limits  GLUCOSE, CAPILLARY - Abnormal; Notable for the following components:   Glucose-Capillary 158 (*)    All other components within normal limits  GLUCOSE, CAPILLARY - Abnormal; Notable for the following components:   Glucose-Capillary 171 (*)    All other components within normal limits  GLUCOSE, CAPILLARY - Abnormal; Notable for the following components:   Glucose-Capillary 175 (*)    All other components within normal limits  GLUCOSE, CAPILLARY - Abnormal; Notable for the following components:   Glucose-Capillary 150 (*)    All other components within normal limits  GLUCOSE, CAPILLARY - Abnormal; Notable for the following components:   Glucose-Capillary 182 (*)    All other components within normal limits  GLUCOSE, CAPILLARY - Abnormal; Notable for the following components:    Glucose-Capillary 185 (*)    All other components within normal limits  GLUCOSE, CAPILLARY - Abnormal; Notable for the following components:   Glucose-Capillary 159 (*)    All other components within normal limits  GLUCOSE, CAPILLARY - Abnormal; Notable for the following components:   Glucose-Capillary 173 (*)    All  other components within normal limits  GLUCOSE, CAPILLARY - Abnormal; Notable for the following components:   Glucose-Capillary 254 (*)    All other components within normal limits  GLUCOSE, CAPILLARY - Abnormal; Notable for the following components:   Glucose-Capillary 415 (*)    All other components within normal limits  GLUCOSE, CAPILLARY - Abnormal; Notable for the following components:   Glucose-Capillary 137 (*)    All other components within normal limits  GLUCOSE, CAPILLARY - Abnormal; Notable for the following components:   Glucose-Capillary 191 (*)    All other components within normal limits  CBG MONITORING, ED - Abnormal; Notable for the following components:   Glucose-Capillary 315 (*)    All other components within normal limits  CBG MONITORING, ED - Abnormal; Notable for the following components:   Glucose-Capillary 272 (*)    All other components within normal limits  CBG MONITORING, ED - Abnormal; Notable for the following components:   Glucose-Capillary 271 (*)    All other components within normal limits  CBG MONITORING, ED - Abnormal; Notable for the following components:   Glucose-Capillary 215 (*)    All other components within normal limits  CBG MONITORING, ED - Abnormal; Notable for the following components:   Glucose-Capillary 181 (*)    All other components within normal limits  CBG MONITORING, ED - Abnormal; Notable for the following components:   Glucose-Capillary 146 (*)    All other components within normal limits  CBG MONITORING, ED - Abnormal; Notable for the following components:   Glucose-Capillary 191 (*)    All other components  within normal limits  CBG MONITORING, ED - Abnormal; Notable for the following components:   Glucose-Capillary 167 (*)    All other components within normal limits  MRSA NEXT GEN BY PCR, NASAL  LIPASE, BLOOD  HIV ANTIBODY (ROUTINE TESTING W REFLEX)  MAGNESIUM  PHOSPHORUS  GLUCOSE, CAPILLARY  POC URINE PREG, ED   EKG ED ECG REPORT I, Merwyn Katos, the attending physician, personally viewed and interpreted this ECG. Date: 07/24/2022 EKG Time: 0105 Rate: 122 Rhythm: Tachycardic sinus rhythm QRS Axis: normal Intervals: normal ST/T Wave abnormalities: normal Narrative Interpretation: no evidence of acute ischemia RADIOLOGY ED MD interpretation: One-view portable chest x-ray interpreted by me shows no evidence of acute abnormalities including no pneumonia, pneumothorax, or widened mediastinum -Agree with radiology assessment Official radiology report(s): No results found. PROCEDURES: Critical Care performed: No Ultrasound ED Peripheral IV (Provider)  Date/Time: 07/24/2022 1:32 PM  Performed by: Merwyn Katos, MD Authorized by: Merwyn Katos, MD   Procedure details:    Indications: hydration and multiple failed IV attempts     Skin Prep: chlorhexidine gluconate     Location:  Right forearm   Angiocath:  20 G   Bedside Ultrasound Guided: Yes     Images: not archived     Patient tolerated procedure without complications: Yes     Dressing applied: Yes    MEDICATIONS ORDERED IN ED: Medications  insulin regular, human (MYXREDLIN) 100 units/ 100 mL infusion (0 Units/hr Intravenous Stopped 07/25/22 1654)  lactated ringers infusion ( Intravenous Infusion Verify 07/25/22 1743)  dextrose 5 % in lactated ringers infusion ( Intravenous Stopped 07/25/22 1654)  dextrose 50 % solution 0-50 mL (has no administration in time range)  Chlorhexidine Gluconate Cloth 2 % PADS 6 each (6 each Topical Given 07/25/22 0200)  Oral care mouth rinse (has no administration in time range)   sodium phosphate 30 mmol in dextrose 5 %  250 mL infusion (0 mmol Intravenous Hold 07/25/22 1033)  insulin glargine-yfgn (SEMGLEE) injection 20 Units (20 Units Subcutaneous Given 07/25/22 2151)  insulin aspart (novoLOG) injection 0-15 Units ( Subcutaneous Not Given 07/25/22 1726)  insulin aspart (novoLOG) injection 0-5 Units (0 Units Subcutaneous Hold 07/25/22 2151)  insulin aspart (novoLOG) injection 4 Units (4 Units Subcutaneous Not Given 07/25/22 1726)  ondansetron (ZOFRAN) injection 4 mg (4 mg Intravenous Given 07/24/22 1337)  sodium chloride 0.9 % bolus 1,000 mL (0 mLs Intravenous Stopped 07/24/22 1827)  morphine (PF) 4 MG/ML injection 4 mg (4 mg Intravenous Given 07/24/22 1502)  sodium chloride 0.9 % bolus 1,000 mL (0 mLs Intravenous Stopped 07/24/22 1827)  metoCLOPramide (REGLAN) injection 10 mg (10 mg Intravenous Given 07/24/22 1731)  morphine (PF) 4 MG/ML injection 4 mg (4 mg Intravenous Given 07/24/22 1819)  potassium chloride 10 mEq in 100 mL IVPB (0 mEq Intravenous Stopped 07/24/22 2213)  magnesium sulfate IVPB 2 g 50 mL (0 g Intravenous Stopped 07/25/22 1534)  potassium chloride SA (KLOR-CON M) CR tablet 40 mEq (40 mEq Oral Given 07/25/22 2150)   IMPRESSION / MDM / ASSESSMENT AND PLAN / ED COURSE  I reviewed the triage vital signs and the nursing notes.                              Patient's presentation is most consistent with acute presentation with potential threat to life or bodily function. 21 year old female diabetic presents BIBA for nausea/vomiting  Given History, Exam, and Workup I have a low suspicion for sepsis induced hypoglycemia, long lasting medication overdose, suicidal ideation/purposeful overdose attempt.  Care of this patient will be signed out to the oncoming physician at the end of my shift.  All pertinent patient information conveyed and all questions answered.  All further care and disposition decisions will be made by the oncoming physician.   FINAL  CLINICAL IMPRESSION(S) / ED DIAGNOSES   Final diagnoses:  Diabetic ketoacidosis without coma associated with type 1 diabetes mellitus (HCC)   Rx / DC Orders   ED Discharge Orders     None      Note:  This document was prepared using Dragon voice recognition software and may include unintentional dictation errors.   Merwyn KatosBradler, Nashae Maudlin K, MD 07/25/22 2329

## 2022-07-24 NOTE — Inpatient Diabetes Management (Signed)
Inpatient Diabetes Program Recommendations  AACE/ADA: New Consensus Statement on Inpatient Glycemic Control (2015)  Target Ranges:  Prepandial:   less than 140 mg/dL      Peak postprandial:   less than 180 mg/dL (1-2 hours)      Critically ill patients:  140 - 180 mg/dL   Lab Results  Component Value Date   GLUCAP 272 (H) 07/24/2022   HGBA1C 13.0 (H) 12/02/2021    Review of Glycemic Control  Latest Reference Range & Units 07/24/22 16:51  CO2 22 - 32 mmol/L <7 (L)  Anion gap 5 - 15  DO NOT REPORT  WBC 4.0 - 10.5 K/uL 18.8 (H)  (L): Data is abnormally low (H): Data is abnormally high   Please consider IV insulin.    Will continue to follow while inpatient.  Thank you, Dulce Sellar, MSN, CDCES Diabetes Coordinator Inpatient Diabetes Program [phone] (team pager from 8a-5p)

## 2022-07-24 NOTE — ED Provider Notes (Signed)
Patient signed out to me pending labs.  In brief this is a 21 year old female with type 1 diabetes presenting with nausea and vomiting x1 day.  She has been compliant with her insulin.  UA showing ketones and blood sugars 300.  Concern is for potential DKA.  Getting lab work as patient is a difficult stick.  She does have a line currently has received a liter fluid and Zofran.  My assessment she says she is feeling improved will order another liter of fluid while waiting for labs.  Patient's labs do reveal a with pH 7.03, bicarb of less than 7 and anion gap too high to be reported.  She has a leukocytosis to 19.  Potassium is 4.7 we will start potassium and start insulin drip.  Patient has received 2 L of fluids prior to these labs.  Sugar is 270 will soon need dextrose containing fluids.  Patient on my assessment is complaining of some abdominal pain but her abdomen is benign I have low suspicion for acute surgical process and suspect that this is mainly in the setting of her acidosis.  She will require admission.  CRITICAL CARE Performed by: Randol Kern   Total critical care time: 30 minutes  Critical care time was exclusive of separately billable procedures and treating other patients.  Critical care was necessary to treat or prevent imminent or life-threatening deterioration.  Critical care was time spent personally by me on the following activities: development of treatment plan with patient and/or surrogate as well as nursing, discussions with consultants, evaluation of patient's response to treatment, examination of patient, obtaining history from patient or surrogate, ordering and performing treatments and interventions, ordering and review of laboratory studies, ordering and review of radiographic studies, pulse oximetry and re-evaluation of patient's condition.    Georga Hacking, MD 07/24/22 [phone]

## 2022-07-24 NOTE — ED Notes (Signed)
Unable to obtain blood work in triage. Flex tech notified. Pt stating in triage "I can't keep doing this because I am a diabetic. They need to speed up the process. I have been dealing with this since this morning." Pt throwing up in triage stating a cold rag is the only thing keeping her from vomiting. Pt asking in triage if she can call "[facility] ambulance" this tech advised her that I cannot suggest or recommend that. Pt stating "this is too much but ok"

## 2022-07-24 NOTE — H&P (Signed)
History and Physical   [facility] [place] [facility] Admission History and Physical [employer], D.O.    Patient Name: Krista Gill MR#: [id number] Date of Birth: [birth year] Date of Admission: 07/24/2022  Referring MD/NP/PA: Dr. Sidney Ace Primary Care Physician: Patient, No Pcp Per  Chief Complaint:  Chief Complaint  Patient presents with   Emesis    HPI: Krista Gill is a 21 y.o. female with a known history of diabetes, sickle cell trait presents to the emergency department for evaluation of vomiting and abdominal pain.  Patient was in a usual state of health until this morning when she began vomiting and developed generalized abdominal pain.  She states that she is compliant with her medication regimen, checks her glucose before each meal which ranges from 100-200.  Grandmother at bedside reports that her voice was raspy earlier this week she thought she was coming down with a URI, but no fevers, chills, cough.    Patient states that she easily goes into DKA with any stressor, despite med compliance, most recently after starting metformin and another time with a cellulitis of her leg.    Patient denies fevers/chills, weakness, dizziness, chest pain, shortness of breath,dysuria/frequency, changes in mental status.    Otherwise there has been no change in status. Patient has been taking medication as prescribed and there has been no recent change in medication or diet.  No recent antibiotics.  There has been no recent illness, hospitalizations, or sick contacts.  She did recently travel to [facility].   EMS/ED Course: Patient received 2 L normal saline, insulin via Endo tool, Zofran, Reglan, morphine, potassium. Medical admission has been requested for further management of DKA.  Review of Systems:  CONSTITUTIONAL: No fever/chills, fatigue, weakness, weight gain/loss, headache. EYES: No blurry or double vision. ENT: No tinnitus, postnasal drip, redness or soreness of  the oropharynx. RESPIRATORY: No cough, dyspnea, wheeze.  No hemoptysis.  CARDIOVASCULAR: No chest pain, palpitations, syncope, orthopnea. No lower extremity edema.  GASTROINTESTINAL: Positive nausea, vomiting, abdominal pain, negative diarrhea, constipation.  No hematemesis, melena or hematochezia. GENITOURINARY: No dysuria, frequency, hematuria. ENDOCRINE: No polyuria or nocturia. No heat or cold intolerance. HEMATOLOGY: No anemia, bruising, bleeding. INTEGUMENTARY: No rashes, ulcers, lesions. MUSCULOSKELETAL: No arthritis, gout. NEUROLOGIC: No numbness, tingling, ataxia, seizure-type activity, weakness. PSYCHIATRIC: No anxiety, depression, insomnia.   Past Medical History:  Diagnosis Date   Diabetes mellitus without complication (HCC)    Diabetic ketoacidosis (HCC)    Sickle cell trait (HCC)    Urinary tract infection     Past Surgical History:  Procedure Laterality Date   ADENOIDECTOMY     MYRINGOTOMY     TONSILLECTOMY       reports that she has never smoked. She has been exposed to tobacco smoke. She has never used smokeless tobacco. She reports that she does not drink alcohol and does not use drugs.  Allergies  Allergen Reactions   Pineapple Other (See Comments)    Itchy tongue/throat    Family History  Problem Relation Age of Onset   Sickle cell trait Mother     Prior to Admission medications   Medication Sig Start Date End Date Taking? Authorizing Provider  insulin aspart (NOVOLOG) 100 UNIT/ML FlexPen Inject 11 Units into the skin Gill. Sliding Scale  +1 for every 15 over   Yes [provider]  insulin degludec (TRESIBA) 100 UNIT/ML FlexTouch Pen Inject 60 Units into the skin Gill. 12/04/21  Yes Hollice Espy, MD  Insulin Glargine w/  Trans Port 100 UNIT/ML SOPN Inject 26 units every morning as directed. Patient not taking: Reported on 07/24/2022 07/25/14   [provider]    Physical Exam: Vitals:   07/24/22 1245 07/24/22 1727  07/24/22 1745  BP: (!) 116/96    Pulse: (!) 119 (!) 112 (!) 127  Resp: 20    Temp: 97.9 F (36.6 C)    TempSrc: Axillary    SpO2: 97% 98% 99%    GENERAL: 21 y.o.-year-old female patient, well-developed, well-nourished lying in the bed in no acute distress.  Pleasant and cooperative.   HEENT: Head atraumatic, normocephalic. Pupils equal. Mucus membranes moist. NECK: Supple. No JVD. CHEST: Normal breath sounds bilaterally. No wheezing, rales, rhonchi or crackles. No use of accessory muscles of respiration.  No reproducible chest wall tenderness.  CARDIOVASCULAR: S1, S2 normal. No murmurs, rubs, or gallops. Cap refill <2 seconds. Pulses intact distally.  ABDOMEN: Soft, nondistended, nontender. No rebound, guarding, rigidity. Normoactive bowel sounds present in all four quadrants.  EXTREMITIES: No pedal edema, cyanosis, or clubbing. No calf tenderness or Homan's sign.  NEUROLOGIC: The patient is alert and oriented x 3. Cranial nerves II through XII are grossly intact with no focal sensorimotor deficit. PSYCHIATRIC:  Normal affect, mood, thought content. SKIN: Warm, dry, and intact without obvious rash, lesion, or ulcer.    Labs on Admission:  CBC: Recent Labs  Lab 07/24/22 1651  WBC 18.8*  HGB 13.0  HCT 44.5  MCV 68.1*  PLT 604*   Basic Metabolic Panel: Recent Labs  Lab 07/24/22 1651  NA 140  K 4.7  CL 112*  CO2 <7*  GLUCOSE 302*  BUN 11  CREATININE 0.68  CALCIUM 9.5   GFR: CrCl cannot be calculated (Unknown ideal weight.). Liver Function Tests: Recent Labs  Lab 07/24/22 1651  AST 23  ALT 18  ALKPHOS 104  BILITOT 1.5*  PROT 10.4*  ALBUMIN 5.2*   Recent Labs  Lab 07/24/22 1651  LIPASE 25   No results for input(s): "AMMONIA" in the last 168 hours. Coagulation Profile: No results for input(s): "INR", "PROTIME" in the last 168 hours. Cardiac Enzymes: No results for input(s): "CKTOTAL", "CKMB", "CKMBINDEX", "TROPONINI" in the last 168 hours. BNP (last 3  results) No results for input(s): "PROBNP" in the last 8760 hours. HbA1C: No results for input(s): "HGBA1C" in the last 72 hours. CBG: Recent Labs  Lab 07/24/22 1234 07/24/22 1732 07/24/22 1824  GLUCAP 315* 272* 271*   Lipid Profile: No results for input(s): "CHOL", "HDL", "LDLCALC", "TRIG", "CHOLHDL", "LDLDIRECT" in the last 72 hours. Thyroid Function Tests: No results for input(s): "TSH", "T4TOTAL", "FREET4", "T3FREE", "THYROIDAB" in the last 72 hours. Anemia Panel: No results for input(s): "VITAMINB12", "FOLATE", "FERRITIN", "TIBC", "IRON", "RETICCTPCT" in the last 72 hours. Urine analysis:    Component Value Date/Time   COLORURINE STRAW (A) 07/24/2022 1342   APPEARANCEUR CLEAR (A) 07/24/2022 1342   APPEARANCEUR Hazy 12/13/2011 0800   LABSPEC 1.017 07/24/2022 1342   LABSPEC 1.021 12/13/2011 0800   PHURINE 5.0 07/24/2022 1342   GLUCOSEU >=500 (A) 07/24/2022 1342   GLUCOSEU >=500 12/13/2011 0800   HGBUR NEGATIVE 07/24/2022 1342   BILIRUBINUR NEGATIVE 07/24/2022 1342   BILIRUBINUR Negative 12/13/2011 0800   KETONESUR 80 (A) 07/24/2022 1342   PROTEINUR >=300 (A) 07/24/2022 1342   NITRITE NEGATIVE 07/24/2022 1342   LEUKOCYTESUR NEGATIVE 07/24/2022 1342   LEUKOCYTESUR Negative 12/13/2011 0800   Sepsis Labs: @LABRCNTIP (procalcitonin:4,lacticidven:4) )No results found for this or any previous visit (from the past 240  hour(s)).   Radiological Exams on Admission: No results found.  EKG: Afib at 133.  Will repeat  Assessment/Plan  This is a 21 y.o. female with a history of diabetes, sickle cell trait now being admitted with:  #. Diabetic ketoacidosis -Admit to stepdown  -IV insulin drip via Endo tool -Aggressive IV fluid  -NPO -BMP every 4 hours  -Replace electrolytes as needed  -Follow up cultures -Hold home Lantus for now -Repeat EKG and notify call team   Admission status: Inpatient, stepdown IV Fluids: Lactated Ringer's and D5 per protocol Diet/Nutrition:  N.p.o. Consults called: Diabetes education DVT Px:  SCDs and early ambulation. Code Status: Full Code  Disposition Plan: To home in 1-2 days  All the records are reviewed and case discussed with ED provider. Management plans discussed with the patient and/or family who express understanding and agree with plan of care.  Nakhia Levitan D.O. on 07/24/2022 at 6:42 PM CC: Primary care physician; Patient, No Pcp Per   07/24/2022, 6:42 PM

## 2022-07-24 NOTE — ED Notes (Signed)
Pt refusing to get up and walk to the bathroom. States she wants a bed pan.

## 2022-07-24 NOTE — Inpatient Diabetes Management (Addendum)
Inpatient Diabetes Program Recommendations  AACE/ADA: New Consensus Statement on Inpatient Glycemic Control (2015)  Target Ranges:  Prepandial:   less than 140 mg/dL      Peak postprandial:   less than 180 mg/dL (1-2 hours)      Critically ill patients:  140 - 180 mg/dL   Lab Results  Component Value Date   GLUCAP 315 (H) 07/24/2022   HGBA1C 13.0 (H) 12/02/2021    Review of Glycemic Control  Latest Reference Range & Units 07/24/22 12:34  Glucose-Capillary 70 - 99 mg/dL 315 (H)  (H): Data is abnormally high  Diabetes history: DM1(does not make insulin.  Needs correction, basal and meal coverage) Outpatient Diabetes medications: Tresiba 60 untis QAM, Novolog 0-50 units daily (takes 11 units for meal coverage (takes 14 units for meal coverage when on menstrual cycle) plus 1 unit for every 50 mg/dl above target glucose of 150 mg/dl) Endocrinologist is [employer] with patient briefly at  bedside.   Confirmed above home medications.  She started feeling bad very early this morning; nausea and vomiting.  She is complaining of severe abdominal pain.  She states she took her insulin this morning as normal.  Asked for b-met and beta-hydroxybutyrate to rule out DKA.  Could be gastroparesis flare?  Patient asking for pain medicine.  Last A1c was 13.1% in March 2023 which reveals poor glucose control.  She does not want to talk at this time due to pain.  Will follow up if admitted.   Will continue to follow while inpatient.  Thank you, Reche Dixon, MSN, [place] Diabetes Coordinator Inpatient Diabetes Program [phone] (team pager from 8a-5p)

## 2022-07-24 NOTE — ED Notes (Signed)
Admitting provider at bedside.

## 2022-07-24 NOTE — ED Triage Notes (Signed)
Pt comes with c/o vomiting that started this am. Pt states she is diabetic and might be in DKA.  Pt states pain in belly 8/10.  Pt states CBG was 211. Pt states all diabetic meds as prescribed.

## 2022-07-25 ENCOUNTER — Other Ambulatory Visit: Payer: Self-pay

## 2022-07-25 ENCOUNTER — Encounter: Payer: Self-pay | Admitting: Family Medicine

## 2022-07-25 DIAGNOSIS — E1010 Type 1 diabetes mellitus with ketoacidosis without coma: Secondary | ICD-10-CM

## 2022-07-25 LAB — GLUCOSE, CAPILLARY
Glucose-Capillary: 209 mg/dL — ABNORMAL HIGH (ref 70–99)
Glucose-Capillary: 175 mg/dL — ABNORMAL HIGH (ref 70–99)
Glucose-Capillary: 171 mg/dL — ABNORMAL HIGH (ref 70–99)
Glucose-Capillary: 158 mg/dL — ABNORMAL HIGH (ref 70–99)
Glucose-Capillary: 171 mg/dL — ABNORMAL HIGH (ref 70–99)
Glucose-Capillary: 175 mg/dL — ABNORMAL HIGH (ref 70–99)
Glucose-Capillary: 150 mg/dL — ABNORMAL HIGH (ref 70–99)
Glucose-Capillary: 182 mg/dL — ABNORMAL HIGH (ref 70–99)
Glucose-Capillary: 185 mg/dL — ABNORMAL HIGH (ref 70–99)
Glucose-Capillary: 159 mg/dL — ABNORMAL HIGH (ref 70–99)
Glucose-Capillary: 173 mg/dL — ABNORMAL HIGH (ref 70–99)
Glucose-Capillary: 254 mg/dL — ABNORMAL HIGH (ref 70–99)
Glucose-Capillary: 137 mg/dL — ABNORMAL HIGH (ref 70–99)
Glucose-Capillary: 415 mg/dL — ABNORMAL HIGH (ref 70–99)
Glucose-Capillary: 85 mg/dL (ref 70–99)
Glucose-Capillary: 191 mg/dL — ABNORMAL HIGH (ref 70–99)

## 2022-07-25 LAB — HEMOGLOBIN A1C
Hgb A1c MFr Bld: 11.6 % — ABNORMAL HIGH (ref 4.8–5.6)
Mean Plasma Glucose: 286.22 mg/dL

## 2022-07-25 LAB — BASIC METABOLIC PANEL
Anion gap: 10 (ref 5–15)
Anion gap: 10 (ref 5–15)
Anion gap: 16 — ABNORMAL HIGH (ref 5–15)
BUN: 6 mg/dL (ref 6–20)
BUN: 6 mg/dL (ref 6–20)
BUN: 8 mg/dL (ref 6–20)
CO2: 15 mmol/L — ABNORMAL LOW (ref 22–32)
CO2: 18 mmol/L — ABNORMAL LOW (ref 22–32)
CO2: 8 mmol/L — ABNORMAL LOW (ref 22–32)
Calcium: 9.2 mg/dL (ref 8.9–10.3)
Calcium: 9.4 mg/dL (ref 8.9–10.3)
Calcium: 9.6 mg/dL (ref 8.9–10.3)
Chloride: 113 mmol/L — ABNORMAL HIGH (ref 98–111)
Chloride: 118 mmol/L — ABNORMAL HIGH (ref 98–111)
Chloride: 121 mmol/L — ABNORMAL HIGH (ref 98–111)
Creatinine, Ser: 0.54 mg/dL (ref 0.44–1.00)
Creatinine, Ser: 0.55 mg/dL (ref 0.44–1.00)
Creatinine, Ser: 0.81 mg/dL (ref 0.44–1.00)
GFR, Estimated: >60 mL/min (ref >60)
GFR, Estimated: >60 mL/min (ref >60)
GFR, Estimated: >60 mL/min (ref >60)
Glucose, Bld: 174 mg/dL — ABNORMAL HIGH (ref 70–99)
Glucose, Bld: 192 mg/dL — ABNORMAL HIGH (ref 70–99)
Glucose, Bld: 220 mg/dL — ABNORMAL HIGH (ref 70–99)
Potassium: 3 mmol/L — ABNORMAL LOW (ref 3.5–5.1)
Potassium: 3.4 mmol/L — ABNORMAL LOW (ref 3.5–5.1)
Potassium: 5.2 mmol/L — ABNORMAL HIGH (ref 3.5–5.1)
Sodium: 141 mmol/L (ref 135–145)
Sodium: 143 mmol/L (ref 135–145)
Sodium: 145 mmol/L (ref 135–145)

## 2022-07-25 LAB — BETA-HYDROXYBUTYRIC ACID
Beta-Hydroxybutyric Acid: 6.58 mmol/L — ABNORMAL HIGH (ref 0.05–0.27)
Beta-Hydroxybutyric Acid: 2.78 mmol/L — ABNORMAL HIGH (ref 0.05–0.27)
Beta-Hydroxybutyric Acid: 1.03 mmol/L — ABNORMAL HIGH (ref 0.05–0.27)

## 2022-07-25 LAB — MRSA NEXT GEN BY PCR, NASAL: MRSA by PCR Next Gen: NOT DETECTED

## 2022-07-25 LAB — MAGNESIUM: Magnesium: 1.8 mg/dL (ref 1.7–2.4)

## 2022-07-25 LAB — HIV ANTIBODY (ROUTINE TESTING W REFLEX): HIV Screen 4th Generation wRfx: NONREACTIVE

## 2022-07-25 LAB — PHOSPHORUS: Phosphorus: 2.8 mg/dL (ref 2.5–4.6)

## 2022-07-25 LAB — CBG MONITORING, ED
Glucose-Capillary: 167 mg/dL — ABNORMAL HIGH (ref 70–99)
Glucose-Capillary: 191 mg/dL — ABNORMAL HIGH (ref 70–99)

## 2022-07-25 MED ORDER — MAGNESIUM SULFATE 4 GM/100ML IV SOLN
4.0000 g | Freq: Once | INTRAVENOUS | Status: DC
  Filled 2022-07-25: qty 100

## 2022-07-25 MED ORDER — CHLORHEXIDINE GLUCONATE CLOTH 2 % EX PADS
6.0000 | MEDICATED_PAD | Freq: Every day | CUTANEOUS | Status: DC
  Administered 2022-07-25: 6 via TOPICAL

## 2022-07-25 MED ORDER — SODIUM PHOSPHATES 45 MMOLE/15ML IV SOLN
30.0000 mmol | Freq: Once | INTRAVENOUS | Status: DC
  Filled 2022-07-25: qty 10

## 2022-07-25 MED ORDER — INSULIN GLARGINE-YFGN 100 UNIT/ML ~~LOC~~ SOLN
20.0000 [IU] | Freq: Two times a day (BID) | SUBCUTANEOUS | Status: DC
  Administered 2022-07-25 – 2022-07-26 (×3): 20 [IU] via SUBCUTANEOUS
  Filled 2022-07-25 (×4): qty 0.2

## 2022-07-25 MED ORDER — INSULIN ASPART 100 UNIT/ML IJ SOLN: 0.0000 [IU] | Freq: Every day | INTRAMUSCULAR | Status: DC

## 2022-07-25 MED ORDER — POTASSIUM CHLORIDE CRYS ER 20 MEQ PO TBCR
40.0000 meq | EXTENDED_RELEASE_TABLET | Freq: Three times a day (TID) | ORAL | Status: AC
  Administered 2022-07-25 (×2): 40 meq via ORAL
  Filled 2022-07-25 (×2): qty 2

## 2022-07-25 MED ORDER — MAGNESIUM SULFATE 2 GM/50ML IV SOLN
2.0000 g | Freq: Once | INTRAVENOUS | Status: AC
  Administered 2022-07-25: 2 g via INTRAVENOUS
  Filled 2022-07-25: qty 50

## 2022-07-25 MED ORDER — ORAL CARE MOUTH RINSE: 15.0000 mL | OROMUCOSAL | Status: DC | PRN

## 2022-07-25 MED ORDER — INSULIN ASPART 100 UNIT/ML IJ SOLN
4.0000 [IU] | Freq: Three times a day (TID) | INTRAMUSCULAR | Status: DC
  Administered 2022-07-26 (×2): 4 [IU] via SUBCUTANEOUS
  Filled 2022-07-25 (×2): qty 1

## 2022-07-25 MED ORDER — INSULIN ASPART 100 UNIT/ML IJ SOLN
0.0000 [IU] | Freq: Three times a day (TID) | INTRAMUSCULAR | Status: DC
  Administered 2022-07-26: 5 [IU] via SUBCUTANEOUS
  Administered 2022-07-26: 15 [IU] via SUBCUTANEOUS
  Filled 2022-07-25 (×2): qty 1

## 2022-07-25 NOTE — TOC Initial Note (Signed)
Transition of Care [facility]) - Initial/Assessment Note    Patient Details  Name: Krista Gill MRN: [id number] Date of Birth: June 30, 2001  [facility]) CM/SW Contact:    Shelbie Hutching, RN Phone Number: 07/25/2022, 11:24 AM  Clinical Narrative:                  Transition of Care [facility]) Screening Note   Patient Details  Name: Krista Gill Date of Birth: [birth year]   Transition of Care [facility]) CM/SW Contact:    Shelbie Hutching, RN Phone Number: 07/25/2022, 11:24 AM    Transition of Care Department (TOC) has reviewed patient and no TOC needs have been identified at this time. We will continue to monitor patient advancement through interdisciplinary progression rounds. If new patient transition needs arise, please place a TOC consult.          Patient Goals and CMS Choice        Expected Discharge Plan and Services                                                Prior Living Arrangements/Services                       Activities of Daily Living Home Assistive Devices/Equipment: CBG Meter ADL Screening (condition at time of admission) Patient's cognitive ability adequate to safely complete daily activities?: Yes Is the patient deaf or have difficulty hearing?: No Does the patient have difficulty seeing, even when wearing glasses/contacts?: No Does the patient have difficulty concentrating, remembering, or making decisions?: No Patient able to express need for assistance with ADLs?: No Does the patient have difficulty dressing or bathing?: No Independently performs ADLs?: Yes (appropriate for developmental age) Does the patient have difficulty walking or climbing stairs?: No Weakness of Legs: None Weakness of Arms/Hands: None  Permission Sought/Granted                  Emotional Assessment              Admission diagnosis:  DKA (diabetic ketoacidosis) ([place]) [E11.10] Diabetic ketoacidosis without coma  associated with type 1 diabetes mellitus ([place]) [E10.10] Patient Active Problem List   Diagnosis Date Noted   Overweight (BMI 25.0-29.9) 12/03/2021   Hypokalemia 12/02/2021   Leukocytosis 12/02/2021   Cellulitis of left ankle 12/02/2021   DKA (diabetic ketoacidosis) ([place]) 06/14/2021   Poor compliance    DKA (diabetic ketoacidoses) 02/09/2017   Adjustment reaction of adolescence    Non compliance w medication regimen    Type 1 diabetes mellitus with ketoacidosis, uncontrolled ([place])    Dehydration    Ketonuria    Inadequate parental supervision and control    DKA, type 1 ([place]) 10/06/2016   PCP:  Patient, No Pcp Per Pharmacy:   [facility] [place], [place] - [address] [place] [address] Phone: [phone] Fax: [phone]  [facility] [id number], [place] AT [address] [place] [address] Phone: [phone] Fax: [phone]  [facility] [place], [place] - [place] AT [facility] [address] [place] [address] Phone: [phone] Fax: [place] [id number] Lorina Rabon, [place] -  [address] AT [facility] [address] [place] [address] Phone: [phone] Fax: [phone]  [facility] [address] [place] [address] Phone: [phone] Fax: [phone]     Social Determinants of Health (SDOH) Interventions    Readmission Risk Interventions     No data to display

## 2022-07-25 NOTE — Assessment & Plan Note (Signed)
See above

## 2022-07-25 NOTE — Inpatient Diabetes Management (Addendum)
Inpatient Diabetes Program Recommendations  AACE/ADA: New Consensus Statement on Inpatient Glycemic Control (2015)  Target Ranges:  Prepandial:   less than 140 mg/dL      Peak postprandial:   less than 180 mg/dL (1-2 hours)      Critically ill patients:  140 - 180 mg/dL   Lab Results  Component Value Date   GLUCAP 159 (H) 07/25/2022   HGBA1C 11.6 (H) 07/24/2022    Review of Glycemic Control  Latest Reference Range & Units 07/25/22 08:36 07/25/22 10:42 07/25/22 12:28 07/25/22 13:25  Glucose-Capillary 70 - 99 mg/dL 150 (H) 182 (H) 185 (H) 159 (H)  (H): Data is abnormally high  Latest Reference Range & Units 07/25/22 13:28  CO2 22 - 32 mmol/L 18 (L)  Anion gap 5 - 15  10  (L): Data is abnormally low  Diabetes history: DM1(does not make insulin.  Needs correction, basal and meal coverage) Outpatient Diabetes medications: Tresiba 60 untis QAM, Novolog 0-50 units daily (takes 11 units for meal coverage (takes 14 units for meal coverage when on menstrual cycle) plus 1 unit for every 50 mg/dl above target glucose of 150 mg/dl) Endocrinologist is [facility] peds Current Diabetes medications: IV insulin  Inpatient Diabetes Program Recommendations:    Recommend continuing IV insulin until next beta-hydroxybutyrate results as CO2 is 18.     When criteria is met and MD ready to transition to SQ insulin, please consider:  Levemir 20 units BID Novolog 0-9 units TID and 0-5 units QHS Novolog 4 units TID with meals   Met with patient again today at bedside.  She is feeling much better.  She says she went to sleep early a on Tuesday and woke up feeling poorly on Wednesday.  She drove with her mom to take her sister to school.  Mom gave her some medicine for nausea.  She states she took her insulins as prescribed.  Her abdominal pain began to be too much so she came to the ED.    She denies difficulties obtaining insulins.  She does not skip doses of insulin.  Current with [facility] peds  endocrinology.  Will continue to follow while inpatient.  Thank you, Reche Dixon, MSN, [place] Diabetes Coordinator Inpatient Diabetes Program [phone] (team pager from 8a-5p)

## 2022-07-25 NOTE — Progress Notes (Signed)
  Progress Note   Patient: Krista Gill MEQ:[id number] DOB: June 30, 2001 DOA: 07/24/2022     1 DOS: the patient was seen and examined on 07/25/2022   Brief hospital course: Taken from H&P.  Krista Gill is a 21 y.o. female with a known history of diabetes, sickle cell trait presents to the emergency department for evaluation of vomiting and abdominal pain starting on the morning of admission.  Patient was compliant with her medications. History of multiple DKA's with any stressor, despite medication compliance.  ED course. Medically stable, labs consistent with DKA, gap of 16, bicarb of less than 7, blood glucose at 302, beta hydroxy acid more than 8.  CBC with leukocytosis at 18.8, magnesium 1.1, phosphorus 2.4.  UA with ketones, glucose and protein urea.  She was started on Endo tool.  11/16: Vitals remained stable, CBG improving, currently at 175, BMP with mild hypokalemia with potassium of 3.4, bicarb improved to 15, gap at 10, closed x1, hydroxybutyric acid improving 2.78 now.  Repleting magnesium and phosphorous.     Assessment and Plan: * DKA (diabetic ketoacidosis) (HCC) Patient uncontrolled type 1 diabetes mellitus with hyperglycemia and A1c of 11.6.  Per patient she is compliant with insulin but no follow-up with primary care provider or endocrinologist as most of her care was at [facility] and it's harder to go over there.  Care has been closed twice mild with improvement in bicarb to 18. -Transition her to basal and short-acting -Continue to monitor  Type 1 diabetes mellitus with ketoacidosis, uncontrolled (HCC) - See above    Subjective: Patient was seen and examined today.  No more nausea or vomiting.  No abdominal pain.  Per patient she was compliant with her medications but does not follow-up with her providers very regularly as most of her care is at [facility] and it is hard for her to go over there.  She would like to get established locally.  Physical Exam: Vitals:    07/25/22 1000 07/25/22 1229 07/25/22 1300 07/25/22 1400  BP: 113/82 101/70 109/87 107/79  Pulse: 94 84 (!) 106 87  Resp: 11 13 12 15   Temp:  97.7 F (36.5 C)    TempSrc:  Oral    SpO2: 100% 100% 100% 100%   General.  Well-developed young lady, in no acute distress. Pulmonary.  Lungs clear bilaterally, normal respiratory effort. CV.  Regular rate and rhythm, no JVD, rub or murmur. Abdomen.  Soft, nontender, nondistended, BS positive. CNS.  Alert and oriented .  No focal neurologic deficit. Extremities.  No edema, no cyanosis, pulses intact and symmetrical. Psychiatry.  Judgment and insight appears normal.   Data Reviewed: Prior data reviewed  Family Communication: Discussed with Patient.  Disposition: Status is: Inpatient Remains inpatient appropriate because: Severity of illness.  Planned Discharge Destination: Home  DVT prophylaxis.  Lovenox Time spent: 45 minutes  This record has been created using . Errors have been sought and corrected,but may not always be located. Such creation errors do not reflect on the standard of care.   Author: Conservation officer, historic buildings, MD 07/25/2022 2:26 PM  For on call review [email or web address].

## 2022-07-25 NOTE — Progress Notes (Signed)
Patient admitted this shift for DKA. No complaints of pain. Skin intact. Patient profile completed. Continue to educate patient about carbohydrate consumption.

## 2022-07-25 NOTE — Hospital Course (Addendum)
Taken from H&P.  Krista Gill is a 21 y.o. female with a known history of diabetes, sickle cell trait presents to the emergency department for evaluation of vomiting and abdominal pain starting on the morning of admission.  Patient was compliant with her medications. History of multiple DKA's with any stressor, despite medication compliance.  ED course. Medically stable, labs consistent with DKA, gap of 16, bicarb of less than 7, blood glucose at 302, beta hydroxy acid more than 8.  CBC with leukocytosis at 18.8, magnesium 1.1, phosphorus 2.4.  UA with ketones, glucose and protein urea.  She was started on Endo tool.  11/16: Vitals remained stable, CBG improving, currently at 175, BMP with mild hypokalemia with potassium of 3.4, bicarb improved to 15, gap at 10, closed x1, hydroxybutyric acid improving 2.78 now.  Repleting magnesium and phosphorous.  Gap closed twice in the afternoon so she is being transitioned to basal and NovoLog.  A1c elevated at 11.6.  Patient wears trying to establish with local providers with no success yet.  11/17: Patient remained stable and gap remained closed.  Bicarb improved to 21 this morning.  Eating and drinking okay. We increased the home dose of NovoLog and asked patient to have a close follow-up with endocrinology for further recommendations.  Patient would like to go home.  She can continue with her basal and short-acting insulin and need to get established locally for further management of her diabetes.

## 2022-07-25 NOTE — Progress Notes (Signed)
Notified NP Foust of elevated heart rate. No new orders were given at this time.

## 2022-07-25 NOTE — Progress Notes (Signed)
       CROSS COVER NOTE  NAME: Krista Gill MRN: [id number] DOB : [birth year]    Time of Service   2215  HPI/Events of Note   Patient off insulin drip since 1600. Blood glucose stable and eating well  Assessment and  Interventions   Assessment:  Plan: Transfer to medsurg       Donnie Mesa  NP [facility]

## 2022-07-25 NOTE — Assessment & Plan Note (Signed)
Patient uncontrolled type 1 diabetes mellitus with hyperglycemia and A1c of 11.6.  Per patient she is compliant with insulin but no follow-up with primary care provider or endocrinologist as most of her care was at [facility] and it's harder to go over there.  Care has been closed twice mild with improvement in bicarb to 18. -Transition her to basal and short-acting -Continue to monitor

## 2022-07-25 NOTE — Plan of Care (Signed)
  Problem: Fluid Volume: Goal: Ability to maintain a balanced intake and output will improve Outcome: Progressing   Problem: Health Behavior/Discharge Planning: Goal: Ability to identify and utilize available resources and services will improve Outcome: Not Progressing

## 2022-07-26 LAB — BASIC METABOLIC PANEL
Anion gap: 6 (ref 5–15)
BUN: 7 mg/dL (ref 6–20)
CO2: 21 mmol/L — ABNORMAL LOW (ref 22–32)
Calcium: 8.8 mg/dL — ABNORMAL LOW (ref 8.9–10.3)
Chloride: 110 mmol/L (ref 98–111)
Creatinine, Ser: 0.47 mg/dL (ref 0.44–1.00)
GFR, Estimated: >60 mL/min (ref >60)
Glucose, Bld: 216 mg/dL — ABNORMAL HIGH (ref 70–99)
Potassium: 3.8 mmol/L (ref 3.5–5.1)
Sodium: 137 mmol/L (ref 135–145)

## 2022-07-26 LAB — GLUCOSE, CAPILLARY
Glucose-Capillary: 223 mg/dL — ABNORMAL HIGH (ref 70–99)
Glucose-Capillary: 297 mg/dL — ABNORMAL HIGH (ref 70–99)

## 2022-07-26 MED ORDER — INSULIN ASPART 100 UNIT/ML FLEXPEN: 15.0000 [IU] | PEN_INJECTOR | Freq: Every day | SUBCUTANEOUS | 11 refills | Status: DC

## 2022-07-26 MED ORDER — INSULIN DEGLUDEC 100 UNIT/ML ~~LOC~~ SOPN: 60.0000 [IU] | PEN_INJECTOR | Freq: Every day | SUBCUTANEOUS | 11 refills | Status: DC

## 2022-07-26 NOTE — Plan of Care (Signed)
Pt BP 106/78 (BP Location: Right Arm)   Pulse 77   Temp 97.9 F (36.6 C) (Oral)   Resp 18   Ht 4\' 11"  (1.499 m)   Wt 69.3 kg   SpO2 100%   BMI 30.86 kg/m  Blood sugar of 297 given 13 units of coverage before discharge. No c/o pain. IV's removed. Pt belongings returned and pt escorted by grandmother to home. 07/26/22 11:46 AM

## 2022-07-26 NOTE — Inpatient Diabetes Management (Signed)
Inpatient Diabetes Program Recommendations  AACE/ADA: New Consensus Statement on Inpatient Glycemic Control (2015)  Target Ranges:  Prepandial:   less than 140 mg/dL      Peak postprandial:   less than 180 mg/dL (1-2 hours)      Critically ill patients:  140 - 180 mg/dL   Lab Results  Component Value Date   GLUCAP 223 (H) 07/26/2022   HGBA1C 11.6 (H) 07/24/2022   Spoke with patient again this morning at bedside.  She states she will discharge home today.  We talked about importance of getting her blood sugars closer to 150 mg/dL.  She should F/U with endo every 3 months, bring meter to PCP office, long and short term complications of uncontrolled BG, and importance of exercise.  She states she is going to start seeing a Designer, television/film set.    She is not interested in a CGM.    Will continue to follow while inpatient.  Thank you, Dulce Sellar, MSN, [facility] Diabetes Coordinator Inpatient Diabetes Program [phone] (team pager from 8a-5p)

## 2022-07-26 NOTE — Discharge Summary (Signed)
Physician Discharge Summary   Patient: Krista Gill MRN: [id number] DOB: 2001-08-27  Admit date:     07/24/2022  Discharge date: 07/26/22  Discharge Physician: Arnetha Courser   PCP: Patient, No Pcp Per   Recommendations at discharge:  Follow-up with endocrinology within a week.  Discharge Diagnoses: Principal Problem:   DKA (diabetic ketoacidosis) (HCC) Active Problems:   Type 1 diabetes mellitus with ketoacidosis, uncontrolled [facility])   Hospital Course: Taken from H&P.  Krista Gill is a 21 y.o. female with a known history of diabetes, sickle cell trait presents to the emergency department for evaluation of vomiting and abdominal pain starting on the morning of admission.  Patient was compliant with her medications. History of multiple DKA's with any stressor, despite medication compliance.  ED course. Medically stable, labs consistent with DKA, gap of 16, bicarb of less than 7, blood glucose at 302, beta hydroxy acid more than 8.  CBC with leukocytosis at 18.8, magnesium 1.1, phosphorus 2.4.  UA with ketones, glucose and protein urea.  She was started on Endo tool.  11/16: Vitals remained stable, CBG improving, currently at 175, BMP with mild hypokalemia with potassium of 3.4, bicarb improved to 15, gap at 10, closed x1, hydroxybutyric acid improving 2.78 now.  Repleting magnesium and phosphorous.  Gap closed twice in the afternoon so she is being transitioned to basal and NovoLog.  A1c elevated at 11.6.  Patient wears trying to establish with local providers with no success yet.  11/17: Patient remained stable and gap remained closed.  Bicarb improved to 21 this morning.  Eating and drinking okay. We increased the home dose of NovoLog and asked patient to have a close follow-up with endocrinology for further recommendations.  Patient would like to go home.  She can continue with her basal and short-acting insulin and need to get established locally for further  management of her diabetes.  Assessment and Plan: * DKA (diabetic ketoacidosis) (HCC) Patient uncontrolled type 1 diabetes mellitus with hyperglycemia and A1c of 11.6.  Per patient she is compliant with insulin but no follow-up with primary care provider or endocrinologist as most of her care was at [facility] and it's harder to go over there.  Care has been closed twice mild with improvement in bicarb to 18. -Transition her to basal and short-acting -Continue to monitor  Type 1 diabetes mellitus with ketoacidosis, uncontrolled (HCC) - See above    Consultants: None Procedures performed: None Disposition: PatientHome Diet recommendation:  Discharge Diet Orders (From admission, onward)     Start     Ordered   07/26/22 0000  Diet - low sodium heart healthy        07/26/22 1120           Carb modified diet DISCHARGE MEDICATION: Allergies as of 07/26/2022       Reactions   Pineapple Other (See Comments)   Itchy tongue/throat        Medication List     TAKE these medications    insulin aspart 100 UNIT/ML FlexPen Commonly known as: NOVOLOG Inject 15 Units into the skin daily. Sliding Scale  +1 for every 15 over What changed: how much to take   insulin degludec 100 UNIT/ML FlexTouch Pen Commonly known as: TRESIBA Inject 60 Units into the skin daily.        Follow-up Information     Solum, Marlana Salvage, MD. Schedule an appointment as soon as possible for a visit in 1 week(s).   Specialty: Endocrinology Contact  information: [address] [facility] [place] [address] [phone]                Discharge Exam: Ceasar Mons Weights   07/25/22 2324  Weight: 69.3 kg   General.     In no acute distress. Pulmonary.  Lungs clear bilaterally, normal respiratory effort. CV.  Regular rate and rhythm, no JVD, rub or murmur. Abdomen.  Soft, nontender, nondistended, BS positive. CNS.  Alert and oriented .  No focal neurologic deficit. Extremities.  No  edema, no cyanosis, pulses intact and symmetrical. Psychiatry.  Judgment and insight appears normal.   Condition at discharge: stable  The results of significant diagnostics from this hospitalization (including imaging, microbiology, ancillary and laboratory) are listed below for reference.   Imaging Studies: DG Chest 1 View  Result Date: 07/24/2022 CLINICAL DATA:  Sickle cell disease, DKA, emesis EXAM: CHEST  1 VIEW COMPARISON:  12/02/2021 FINDINGS: Single frontal view of the chest demonstrates an unremarkable cardiac silhouette. No airspace disease, effusion, or pneumothorax. No acute bony abnormalities. IMPRESSION: 1. No acute intrathoracic process. Electronically Signed   By: Sharlet Salina M.D.   On: 07/24/2022 20:41    Microbiology: Results for orders placed or performed during the hospital encounter of 07/24/22  MRSA Next Gen by PCR, Nasal     Status: None   Collection Time: 07/25/22  1:33 AM   Specimen: Nasal Mucosa; Nasal Swab  Result Value Ref Range Status   MRSA by PCR Next Gen NOT DETECTED NOT DETECTED Final    Comment: (NOTE) The GeneXpert MRSA Assay (FDA approved for NASAL specimens only), is one component of a comprehensive MRSA colonization surveillance program. It is not intended to diagnose MRSA infection nor to guide or monitor treatment for MRSA infections. Test performance is not FDA approved in patients less than 91 years old. Performed at [facility], [address] [place], [place] [address]     Labs: CBC: Recent Labs  Lab 07/24/22 1651  WBC 18.8*  HGB 13.0  HCT 44.5  MCV 68.1*  PLT 604*   Basic Metabolic Panel: Recent Labs  Lab 07/24/22 1650 07/24/22 1651 07/24/22 2341 07/25/22 0430 07/25/22 0439 07/25/22 1328 07/26/22 0826  NA  --  140 145  --  143 141 137  K  --  4.7 5.2*  --  3.4* 3.0* 3.8  CL  --  112* 121*  --  118* 113* 110  CO2  --  <7* 8*  --  15* 18* 21*  GLUCOSE  --  302* 220*  --  174* 192* 216*  BUN  --  11 8   --  6 6 7   CREATININE  --  0.68 0.81  --  0.54 0.55 0.47  CALCIUM  --  9.5 9.4  --  9.2 9.6 8.8*  MG 1.1*  --   --  1.8  --   --   --   PHOS 2.4*  --   --  2.8  --   --   --    Liver Function Tests: Recent Labs  Lab 07/24/22 1651  AST 23  ALT 18  ALKPHOS 104  BILITOT 1.5*  PROT 10.4*  ALBUMIN 5.2*   CBG: Recent Labs  Lab 07/25/22 1642 07/25/22 1654 07/25/22 1721 07/25/22 2130 07/26/22 0932  GLUCAP 415* 137* 85 191* 223*    Discharge time spent: greater than 30 minutes.  This record has been created using 07/28/22. Errors have been  sought and corrected,but may not always be located. Such creation errors do not reflect on the standard of care.   Signed: Arnetha Courser, MD [facility] 07/26/2022

## 2022-07-30 LAB — BLOOD GAS, VENOUS
Acid-base deficit: 22.6 mmol/L — ABNORMAL HIGH (ref 0.0–2.0)
Bicarbonate: 6.9 mmol/L — ABNORMAL LOW (ref 20.0–28.0)
O2 Saturation: 72 %
Patient temperature: 37
pCO2, Ven: 26 mmHg — ABNORMAL LOW (ref 44–60)
pH, Ven: 7.03 — CL (ref 7.25–7.43)
pO2, Ven: 54 mmHg — ABNORMAL HIGH (ref 32–45)

## 2022-08-20 ENCOUNTER — Other Ambulatory Visit: Payer: Self-pay

## 2022-08-20 ENCOUNTER — Emergency Department
Admission: EM | Admit: 2022-08-20 | Discharge: 2022-08-20 | Disposition: A | Discharge status: Home / Self Care | Payer: Medicaid Other | Attending: Emergency Medicine | Admitting: Emergency Medicine

## 2022-08-20 DIAGNOSIS — L02411 Cutaneous abscess of right axilla: Secondary | ICD-10-CM | POA: Diagnosis not present

## 2022-08-20 MED ORDER — LIDOCAINE HCL (PF) 1 % IJ SOLN
5.0000 mL | Freq: Once | INTRAMUSCULAR | Status: AC
  Administered 2022-08-20: 5 mL
  Filled 2022-08-20: qty 5

## 2022-08-20 MED ORDER — SULFAMETHOXAZOLE-TRIMETHOPRIM 800-160 MG PO TABS: 1.0000 | ORAL_TABLET | Freq: Two times a day (BID) | ORAL | 0 refills | Status: DC

## 2022-08-20 MED ORDER — HYDROCODONE-ACETAMINOPHEN 5-325 MG PO TABS: 1.0000 | ORAL_TABLET | Freq: Four times a day (QID) | ORAL | 0 refills | Status: DC | PRN

## 2022-08-20 MED ORDER — CEPHALEXIN 500 MG PO CAPS: 500.0000 mg | ORAL_CAPSULE | Freq: Three times a day (TID) | ORAL | 0 refills | Status: DC

## 2022-08-20 NOTE — Discharge Instructions (Addendum)
2 antibiotics were sent to pharmacy for you to begin taking today.  Also prescription for hydrocodone for pain as needed every 6 hours.  Do not drive or operate machinery while taking this medication.  Return to the emergency department in 2 days or an urgent care for the packing to be removed.

## 2022-08-20 NOTE — ED Notes (Signed)
This nurse applied 2 abd. Pain and secured pads by wrapping up the arm. Pt denies and discomfort and little tape was used.

## 2022-08-20 NOTE — ED Provider Notes (Signed)
[facility] Provider Note    Event Date/Time   First MD Initiated Contact with Patient 08/20/22 1130     (approximate)   History   No chief complaint on file.   HPI  Krista Gill is a 21 y.o. female presents to the ED with complaint of an abscess to the right axilla area.  Patient states that she has had to have abscesses opened in the past and is familiar with the procedure.  She denies any fever or chills.     Physical Exam   Triage Vital Signs: ED Triage Vitals [08/20/22 1000]  Enc Vitals Group     BP 121/86     Pulse Rate 87     Resp 18     Temp 98.4 F (36.9 C)     Temp src      SpO2 99 %     Weight 152 lb (68.9 kg)     Height 4\' 11"  (1.499 m)     Head Circumference      Peak Flow      Pain Score 9     Pain Loc      Pain Edu?      Excl. in [place]?     Most recent vital signs: Vitals:   08/20/22 1000  BP: 121/86  Pulse: 87  Resp: 18  Temp: 98.4 F (36.9 C)  SpO2: 99%     General: Awake, no distress.  CV:  Good peripheral perfusion.  Resp:  Normal effort.  Abd:  No distention.  Other:  Right axilla with a large abscess without active drainage.  Area is tender to touch and erythematous.  No surrounding cellulitis is present.   ED Results / Procedures / Treatments   Labs (all labs ordered are listed, but only abnormal results are displayed) Labs Reviewed - No data to display    PROCEDURES:  Critical Care performed:   14/12/23Marland KitchenIncision and Drainage  Date/Time: 08/20/2022 12:50 PM  Performed by: 14/08/2022, PA-C Authorized by: Tommi Rumps, PA-C   Consent:    Consent obtained:  Verbal   Consent given by:  Patient   Risks, benefits, and alternatives were discussed: yes     Risks discussed:  Incomplete drainage and pain Universal protocol:    Patient identity confirmed:  Verbally with patient Location:    Type:  Abscess   Location:  Upper extremity   Upper extremity location:  Arm   Arm location:  R  upper arm Pre-procedure details:    Skin preparation:  Povidone-iodine Sedation:    Sedation type:  None Anesthesia:    Anesthesia method:  Local infiltration   Local anesthetic:  Lidocaine 1% w/o epi Procedure type:    Complexity:  Simple Procedure details:    Incision types:  Single straight   Incision depth:  Dermal   Wound management:  Probed and deloculated   Drainage:  Purulent   Drainage amount:  Moderate   Wound treatment:  Drain placed   Packing materials:  1/4 in iodoform gauze Post-procedure details:    Procedure completion:  Tolerated    MEDICATIONS ORDERED IN ED: Medications  lidocaine (PF) (XYLOCAINE) 1 % injection 5 mL (5 mLs Infiltration Given 08/20/22 1223)     IMPRESSION / MDM / ASSESSMENT AND PLAN / ED COURSE  I reviewed the triage vital signs and the nursing notes.   Differential diagnosis includes, but is not limited to, abscess right axilla, cellulitis.   21 year old female  presents to the ED with abscess to the right axilla that was I&D with moderate amount of purulent drainage.  Patient tolerated procedure well.  A prescription for Norco to take every 6 hours as needed for pain and both Keflex and Bactrim was sent to the pharmacy for her to begin taking as this is the second abscess that she has had.  Patient is aware that the packing needs to be removed in 2 days and she may return to the emergency department or go to an urgent care for this to be done.  She will also follow-up with her PCP if any continued problems or concerns.     Patient's presentation is most consistent with acute, uncomplicated illness.  FINAL CLINICAL IMPRESSION(S) / ED DIAGNOSES   Final diagnoses:  Cutaneous abscess of right axilla     Rx / DC Orders   ED Discharge Orders          Ordered    HYDROcodone-acetaminophen (NORCO/VICODIN) 5-325 MG tablet  Every 6 hours PRN        08/20/22 1325    cephALEXin (KEFLEX) 500 MG capsule  3 times daily        08/20/22 1325     sulfamethoxazole-trimethoprim (BACTRIM DS) 800-160 MG tablet  2 times daily        08/20/22 1325             Note:  This document was prepared using Dragon voice recognition software and may include unintentional dictation errors.   Tommi Rumps, PA-C 08/20/22 1517    Phineas Semen, MD 08/21/22 Norberta Keens

## 2022-08-20 NOTE — ED Triage Notes (Signed)
Pt to ED via POV from home. Pt reports cyst under right armpit that has been present x1 week. Pt reports warm compresses at home but pain has gotten worse. Pt reports hx of same and has had to be drained before.

## 2022-10-14 ENCOUNTER — Inpatient Hospital Stay
Admission: EM | Admit: 2022-10-14 | Discharge: 2022-10-16 | DRG: 639 | Disposition: A | Discharge status: Home / Self Care | Payer: Medicaid Other | Attending: Internal Medicine | Admitting: Internal Medicine

## 2022-10-14 ENCOUNTER — Other Ambulatory Visit: Payer: Self-pay

## 2022-10-14 ENCOUNTER — Encounter: Payer: Self-pay | Admitting: Emergency Medicine

## 2022-10-14 DIAGNOSIS — D573 Sickle-cell trait: Secondary | ICD-10-CM | POA: Diagnosis present

## 2022-10-14 DIAGNOSIS — Z91018 Allergy to other foods: Secondary | ICD-10-CM

## 2022-10-14 DIAGNOSIS — E1010 Type 1 diabetes mellitus with ketoacidosis without coma: Principal | ICD-10-CM | POA: Diagnosis present

## 2022-10-14 DIAGNOSIS — Z794 Long term (current) use of insulin: Secondary | ICD-10-CM

## 2022-10-14 DIAGNOSIS — E876 Hypokalemia: Secondary | ICD-10-CM | POA: Diagnosis present

## 2022-10-14 DIAGNOSIS — Z832 Family history of diseases of the blood and blood-forming organs and certain disorders involving the immune mechanism: Secondary | ICD-10-CM

## 2022-10-14 DIAGNOSIS — E10649 Type 1 diabetes mellitus with hypoglycemia without coma: Secondary | ICD-10-CM | POA: Diagnosis not present

## 2022-10-14 DIAGNOSIS — Z79899 Other long term (current) drug therapy: Secondary | ICD-10-CM

## 2022-10-14 LAB — COMPREHENSIVE METABOLIC PANEL
ALT: 15 U/L (ref 0–44)
AST: 21 U/L (ref 15–41)
Albumin: 4.6 g/dL (ref 3.5–5.0)
Alkaline Phosphatase: 74 U/L (ref 38–126)
Anion gap: 18 — ABNORMAL HIGH (ref 5–15)
BUN: 14 mg/dL (ref 6–20)
CO2: 13 mmol/L — ABNORMAL LOW (ref 22–32)
Calcium: 9.3 mg/dL (ref 8.9–10.3)
Chloride: 102 mmol/L (ref 98–111)
Creatinine, Ser: 0.56 mg/dL (ref 0.44–1.00)
GFR, Estimated: >60 mL/min (ref >60)
Glucose, Bld: 336 mg/dL — ABNORMAL HIGH (ref 70–99)
Potassium: 3.4 mmol/L — ABNORMAL LOW (ref 3.5–5.1)
Sodium: 133 mmol/L — ABNORMAL LOW (ref 135–145)
Total Bilirubin: 1.4 mg/dL — ABNORMAL HIGH (ref 0.3–1.2)
Total Protein: 8.5 g/dL — ABNORMAL HIGH (ref 6.5–8.1)

## 2022-10-14 LAB — CBC
HCT: 35 % — ABNORMAL LOW (ref 36.0–46.0)
Hemoglobin: 10.8 g/dL — ABNORMAL LOW (ref 12.0–15.0)
MCH: 20.7 pg — ABNORMAL LOW (ref 26.0–34.0)
MCHC: 30.9 g/dL (ref 30.0–36.0)
MCV: 67 fL — ABNORMAL LOW (ref 80.0–100.0)
Platelets: 448 10*3/uL — ABNORMAL HIGH (ref 150–400)
RBC: 5.22 MIL/uL — ABNORMAL HIGH (ref 3.87–5.11)
RDW: 17.8 % — ABNORMAL HIGH (ref 11.5–15.5)
WBC: 10.2 10*3/uL (ref 4.0–10.5)
nRBC: 0 % (ref 0.0–0.2)

## 2022-10-14 LAB — BETA-HYDROXYBUTYRIC ACID: Beta-Hydroxybutyric Acid: 4.37 mmol/L — ABNORMAL HIGH (ref 0.05–0.27)

## 2022-10-14 LAB — URINALYSIS, ROUTINE W REFLEX MICROSCOPIC
Bilirubin Urine: NEGATIVE
Glucose, UA: >=500 mg/dL — AB
Ketones, ur: 80 mg/dL — AB
Leukocytes,Ua: NEGATIVE
Nitrite: NEGATIVE
Protein, ur: 30 mg/dL — AB
Specific Gravity, Urine: 1.024 (ref 1.005–1.030)
pH: 5 (ref 5.0–8.0)

## 2022-10-14 LAB — CBG MONITORING, ED: Glucose-Capillary: 335 mg/dL — ABNORMAL HIGH (ref 70–99)

## 2022-10-14 LAB — BLOOD GAS, VENOUS
Acid-base deficit: 5.6 mmol/L — ABNORMAL HIGH (ref 0.0–2.0)
Bicarbonate: 16 mmol/L — ABNORMAL LOW (ref 20.0–28.0)
O2 Saturation: 93.5 %
Patient temperature: 37
pCO2, Ven: 22 mmHg — ABNORMAL LOW (ref 44–60)
pH, Ven: 7.47 — ABNORMAL HIGH (ref 7.25–7.43)
pO2, Ven: 64 mmHg — ABNORMAL HIGH (ref 32–45)

## 2022-10-14 LAB — LIPASE, BLOOD: Lipase: 27 U/L (ref 11–51)

## 2022-10-14 LAB — LACTIC ACID, PLASMA: Lactic Acid, Venous: 1.3 mmol/L (ref 0.5–1.9)

## 2022-10-14 LAB — POC URINE PREG, ED: Preg Test, Ur: NEGATIVE

## 2022-10-14 MED ORDER — POTASSIUM CHLORIDE CRYS ER 20 MEQ PO TBCR: 40.0000 meq | EXTENDED_RELEASE_TABLET | Freq: Once | ORAL | Status: DC

## 2022-10-14 MED ORDER — SODIUM CHLORIDE 0.9 % IV BOLUS
1000.0000 mL | Freq: Once | INTRAVENOUS | Status: AC
  Administered 2022-10-14: 1000 mL via INTRAVENOUS

## 2022-10-14 MED ORDER — KETOROLAC TROMETHAMINE 15 MG/ML IJ SOLN
15.0000 mg | Freq: Once | INTRAMUSCULAR | Status: AC
  Administered 2022-10-14: 15 mg via INTRAVENOUS
  Filled 2022-10-14: qty 1

## 2022-10-14 MED ORDER — ONDANSETRON HCL 4 MG/2ML IJ SOLN
4.0000 mg | Freq: Once | INTRAMUSCULAR | Status: AC
  Administered 2022-10-14: 4 mg via INTRAVENOUS
  Filled 2022-10-14: qty 2

## 2022-10-14 MED ORDER — POTASSIUM CHLORIDE 10 MEQ/100ML IV SOLN: 10.0000 meq | Freq: Once | INTRAVENOUS | Status: DC

## 2022-10-14 NOTE — ED Triage Notes (Signed)
Pt to ED from home c/o lower mid abd pain and pelvic pain that comes on with her cycle which started today.  States happens every time with menstrual cycle and this feels similar.  States menstrual cycle is normal per her.  States nausea and vomiting x3 today. Wanting to get referral to OBGYN and on [facility] to regulate her periods.  Pt A&Ox4, chest rise even and unlabored, skin WNL and in NAD at this time.

## 2022-10-14 NOTE — ED Provider Notes (Signed)
[facility] Provider Note    Event Date/Time   First MD Initiated Contact with Patient 10/14/22 2136     (approximate)   History   Chief Complaint Abdominal Pain   HPI Krista Gill is a 22 y.o. female, history of type 1 diabetes (on NovoLog and [place]), presents to the emergency department for evaluation of mid abdominal pain/pelvic pain.  She states that her pain started today.  She attributes it to her menstrual cycle, stating that feels like same as it does every month.  Additionally reporting nausea with 3 episodes of vomiting today.  She is requesting pain medication and nausea medication, as well as a referral to OB/GYN to help regulate her cycles.  Denies fever/chills, chest pain, shortness of breath, flank pain, hematemesis, hematochezia, diarrhea, urinary symptoms, headache, weakness, restless lesions, or dizziness/lightheadedness.  History Limitations: No limitations.        Physical Exam  Triage Vital Signs: ED Triage Vitals  Enc Vitals Group     BP 10/14/22 2105 (!) 148/98     Pulse Rate 10/14/22 2105 82     Resp 10/14/22 2105 18     Temp 10/14/22 2105 97.6 F (36.4 C)     Temp Source 10/14/22 2105 Oral     SpO2 10/14/22 2105 98 %     Weight 10/14/22 2102 153 lb (69.4 kg)     Height 10/14/22 2102 4\' 11"  (1.499 m)     Head Circumference --      Peak Flow --      Pain Score 10/14/22 2101 6     Pain Loc --      Pain Edu? --      Excl. in [place]? --     Most recent vital signs: Vitals:   10/14/22 2105  BP: (!) 148/98  Pulse: 82  Resp: 18  Temp: 97.6 F (36.4 C)  SpO2: 98%    General: Awake, NAD.  Skin: Warm, dry. No rashes or lesions.  Eyes: PERRL. Conjunctivae normal.  CV: Good peripheral perfusion.  Resp: Normal effort.  Abd: Soft, non-tender. No distention.  Neuro: At baseline. No gross neurological deficits.  Musculoskeletal: Normal ROM of all extremities.  Physical Exam    ED Results / Procedures / Treatments   Labs (all labs ordered are listed, but only abnormal results are displayed) Labs Reviewed  COMPREHENSIVE METABOLIC PANEL - Abnormal; Notable for the following components:      Result Value   Sodium 133 (*)    Potassium 3.4 (*)    CO2 13 (*)    Glucose, Bld 336 (*)    Total Protein 8.5 (*)    Total Bilirubin 1.4 (*)    Anion gap 18 (*)    All other components within normal limits  CBC - Abnormal; Notable for the following components:   RBC 5.22 (*)    Hemoglobin 10.8 (*)    HCT 35.0 (*)    MCV 67.0 (*)    MCH 20.7 (*)    RDW 17.8 (*)    Platelets 448 (*)    All other components within normal limits  URINALYSIS, ROUTINE W REFLEX MICROSCOPIC - Abnormal; Notable for the following components:   Color, Urine STRAW (*)    APPearance CLEAR (*)    Glucose, UA >=500 (*)    Hgb urine dipstick MODERATE (*)    Ketones, ur 80 (*)    Protein, ur 30 (*)    Bacteria, UA RARE (*)  All other components within normal limits  BETA-HYDROXYBUTYRIC ACID - Abnormal; Notable for the following components:   Beta-Hydroxybutyric Acid 4.37 (*)    All other components within normal limits  BLOOD GAS, VENOUS - Abnormal; Notable for the following components:   pH, Ven 7.47 (*)    pCO2, Ven 22 (*)    pO2, Ven 64 (*)    Bicarbonate 16.0 (*)    Acid-base deficit 5.6 (*)    All other components within normal limits  CBG MONITORING, ED - Abnormal; Notable for the following components:   Glucose-Capillary 335 (*)    All other components within normal limits  LIPASE, BLOOD  LACTIC ACID, PLASMA  LACTIC ACID, PLASMA  BASIC METABOLIC PANEL  BASIC METABOLIC PANEL  BASIC METABOLIC PANEL  BASIC METABOLIC PANEL  BASIC METABOLIC PANEL  POC URINE PREG, ED     EKG Pending.    RADIOLOGY  ED Provider Interpretation: N/A.  No results found.  PROCEDURES:  Critical Care performed: N/A.  Procedures    MEDICATIONS ORDERED IN ED: Medications  insulin regular, human (MYXREDLIN) 100 units/ 100  mL infusion (has no administration in time range)  lactated ringers infusion (has no administration in time range)  dextrose 5 % in lactated ringers infusion (has no administration in time range)  dextrose 50 % solution 0-50 mL (has no administration in time range)  potassium chloride 10 mEq in 100 mL IVPB (has no administration in time range)  sodium chloride 0.9 % bolus 1,000 mL (1,000 mLs Intravenous New Bag/Given 10/14/22 2326)  ondansetron (ZOFRAN) injection 4 mg (4 mg Intravenous Given 10/14/22 2328)  ketorolac (TORADOL) 15 MG/ML injection 15 mg (15 mg Intravenous Given 10/14/22 2329)     IMPRESSION / MDM / ASSESSMENT AND PLAN / ED COURSE  I reviewed the triage vital signs and the nursing notes.                              Differential diagnosis includes, but is not limited to, gastroenteritis, diabetic ketoacidosis, hyperglycemia, menstrual cramping, ovarian cyst, diverticulitis, appendicitis.  ED Course Patient appears clinically stable, vitals within normal limits.  NAD.  She is requesting pain and nausea medicine at this time.  Will initiate IV fluids, ketorolac, and ondansetron.  CBC shows no leukocytosis.  Moderate anemia present with hemoglobin of 10.8.  CMP shows elevated glucose at 336 with elevated anion gap at 18.  No significant electrolyte abnormalities.  No transaminitis.  No AKI.  Urinalysis shows ketonuria and proteinuria.  Mild amount of hemoglobin.  No evidence of infection.  Patient hydroxybutyric acid elevated at 4.37.  Venous blood gas shows decreased bicarbonate at 16, elevated pH at 7.47  Assessment/Plan Patient presents with mild-moderate lower abdominal pain associated with 3 episodes of vomiting x 1 day.  Treated here initially with IV fluids, Decadron, and ketorolac.  She does not have any remarkable findings on physical exam to suggest any acute abdominal pathology.  I do not believe imaging is needed at this time.  Given her symptomatology, elevated  glucose at 335, ketonuria, elevated beta hydroxybutyrate acid at 4.37, anion gap of 18, decreased bicarbonate of 16.0, highly suspicious for DKA at this time.  Initiate treatment here with IV fluids, potassium supplementation, and insulin infusion.  Will plan to admit to hospitalist.  Patient's presentation is most consistent with acute presentation with potential threat to life or bodily function.       FINAL CLINICAL IMPRESSION(S) /  ED DIAGNOSES   Final diagnoses:  Type 1 diabetes mellitus with ketoacidosis without coma ([place])     Rx / DC Orders   ED Discharge Orders     None        Note:  This document was prepared using Dragon voice recognition software and may include unintentional dictation errors.

## 2022-10-15 DIAGNOSIS — D573 Sickle-cell trait: Secondary | ICD-10-CM | POA: Diagnosis present

## 2022-10-15 DIAGNOSIS — Z794 Long term (current) use of insulin: Secondary | ICD-10-CM | POA: Diagnosis not present

## 2022-10-15 DIAGNOSIS — Z91018 Allergy to other foods: Secondary | ICD-10-CM | POA: Diagnosis not present

## 2022-10-15 DIAGNOSIS — E1010 Type 1 diabetes mellitus with ketoacidosis without coma: Secondary | ICD-10-CM | POA: Diagnosis not present

## 2022-10-15 DIAGNOSIS — E876 Hypokalemia: Secondary | ICD-10-CM

## 2022-10-15 DIAGNOSIS — Z79899 Other long term (current) drug therapy: Secondary | ICD-10-CM | POA: Diagnosis not present

## 2022-10-15 DIAGNOSIS — E10649 Type 1 diabetes mellitus with hypoglycemia without coma: Secondary | ICD-10-CM | POA: Diagnosis not present

## 2022-10-15 DIAGNOSIS — Z832 Family history of diseases of the blood and blood-forming organs and certain disorders involving the immune mechanism: Secondary | ICD-10-CM | POA: Diagnosis not present

## 2022-10-15 LAB — BASIC METABOLIC PANEL
Anion gap: 15 (ref 5–15)
Anion gap: 12 (ref 5–15)
Anion gap: 12 (ref 5–15)
Anion gap: 10 (ref 5–15)
Anion gap: 10 (ref 5–15)
Anion gap: 8 (ref 5–15)
BUN: 13 mg/dL (ref 6–20)
BUN: 10 mg/dL (ref 6–20)
BUN: 9 mg/dL (ref 6–20)
BUN: 8 mg/dL (ref 6–20)
BUN: 7 mg/dL (ref 6–20)
BUN: 6 mg/dL (ref 6–20)
CO2: 13 mmol/L — ABNORMAL LOW (ref 22–32)
CO2: 14 mmol/L — ABNORMAL LOW (ref 22–32)
CO2: 17 mmol/L — ABNORMAL LOW (ref 22–32)
CO2: 17 mmol/L — ABNORMAL LOW (ref 22–32)
CO2: 19 mmol/L — ABNORMAL LOW (ref 22–32)
CO2: 20 mmol/L — ABNORMAL LOW (ref 22–32)
Calcium: 8.5 mg/dL — ABNORMAL LOW (ref 8.9–10.3)
Calcium: 8.5 mg/dL — ABNORMAL LOW (ref 8.9–10.3)
Calcium: 8.3 mg/dL — ABNORMAL LOW (ref 8.9–10.3)
Calcium: 8.6 mg/dL — ABNORMAL LOW (ref 8.9–10.3)
Calcium: 8.8 mg/dL — ABNORMAL LOW (ref 8.9–10.3)
Calcium: 8.6 mg/dL — ABNORMAL LOW (ref 8.9–10.3)
Chloride: 108 mmol/L (ref 98–111)
Chloride: 106 mmol/L (ref 98–111)
Chloride: 108 mmol/L (ref 98–111)
Chloride: 106 mmol/L (ref 98–111)
Chloride: 105 mmol/L (ref 98–111)
Chloride: 107 mmol/L (ref 98–111)
Creatinine, Ser: 0.52 mg/dL (ref 0.44–1.00)
Creatinine, Ser: 0.43 mg/dL — ABNORMAL LOW (ref 0.44–1.00)
Creatinine, Ser: 0.4 mg/dL — ABNORMAL LOW (ref 0.44–1.00)
Creatinine, Ser: 0.42 mg/dL — ABNORMAL LOW (ref 0.44–1.00)
Creatinine, Ser: 0.42 mg/dL — ABNORMAL LOW (ref 0.44–1.00)
Creatinine, Ser: 0.42 mg/dL — ABNORMAL LOW (ref 0.44–1.00)
GFR, Estimated: >60 mL/min (ref >60)
GFR, Estimated: >60 mL/min (ref >60)
GFR, Estimated: >60 mL/min (ref >60)
GFR, Estimated: >60 mL/min (ref >60)
GFR, Estimated: >60 mL/min (ref >60)
GFR, Estimated: >60 mL/min (ref >60)
Glucose, Bld: 230 mg/dL — ABNORMAL HIGH (ref 70–99)
Glucose, Bld: 156 mg/dL — ABNORMAL HIGH (ref 70–99)
Glucose, Bld: 152 mg/dL — ABNORMAL HIGH (ref 70–99)
Glucose, Bld: 121 mg/dL — ABNORMAL HIGH (ref 70–99)
Glucose, Bld: 189 mg/dL — ABNORMAL HIGH (ref 70–99)
Glucose, Bld: 222 mg/dL — ABNORMAL HIGH (ref 70–99)
Potassium: 3.6 mmol/L (ref 3.5–5.1)
Potassium: 3.8 mmol/L (ref 3.5–5.1)
Potassium: 3.5 mmol/L (ref 3.5–5.1)
Potassium: 3.7 mmol/L (ref 3.5–5.1)
Potassium: 3.5 mmol/L (ref 3.5–5.1)
Potassium: 3.3 mmol/L — ABNORMAL LOW (ref 3.5–5.1)
Sodium: 136 mmol/L (ref 135–145)
Sodium: 132 mmol/L — ABNORMAL LOW (ref 135–145)
Sodium: 137 mmol/L (ref 135–145)
Sodium: 133 mmol/L — ABNORMAL LOW (ref 135–145)
Sodium: 134 mmol/L — ABNORMAL LOW (ref 135–145)
Sodium: 135 mmol/L (ref 135–145)

## 2022-10-15 LAB — BETA-HYDROXYBUTYRIC ACID
Beta-Hydroxybutyric Acid: 4.32 mmol/L — ABNORMAL HIGH (ref 0.05–0.27)
Beta-Hydroxybutyric Acid: 2.28 mmol/L — ABNORMAL HIGH (ref 0.05–0.27)
Beta-Hydroxybutyric Acid: 1.94 mmol/L — ABNORMAL HIGH (ref 0.05–0.27)

## 2022-10-15 LAB — CBG MONITORING, ED
Glucose-Capillary: 123 mg/dL — ABNORMAL HIGH (ref 70–99)
Glucose-Capillary: 139 mg/dL — ABNORMAL HIGH (ref 70–99)
Glucose-Capillary: 145 mg/dL — ABNORMAL HIGH (ref 70–99)
Glucose-Capillary: 147 mg/dL — ABNORMAL HIGH (ref 70–99)
Glucose-Capillary: 148 mg/dL — ABNORMAL HIGH (ref 70–99)
Glucose-Capillary: 148 mg/dL — ABNORMAL HIGH (ref 70–99)
Glucose-Capillary: 150 mg/dL — ABNORMAL HIGH (ref 70–99)
Glucose-Capillary: 152 mg/dL — ABNORMAL HIGH (ref 70–99)
Glucose-Capillary: 164 mg/dL — ABNORMAL HIGH (ref 70–99)
Glucose-Capillary: 165 mg/dL — ABNORMAL HIGH (ref 70–99)
Glucose-Capillary: 169 mg/dL — ABNORMAL HIGH (ref 70–99)
Glucose-Capillary: 169 mg/dL — ABNORMAL HIGH (ref 70–99)
Glucose-Capillary: 179 mg/dL — ABNORMAL HIGH (ref 70–99)
Glucose-Capillary: 191 mg/dL — ABNORMAL HIGH (ref 70–99)
Glucose-Capillary: 203 mg/dL — ABNORMAL HIGH (ref 70–99)
Glucose-Capillary: 213 mg/dL — ABNORMAL HIGH (ref 70–99)
Glucose-Capillary: 225 mg/dL — ABNORMAL HIGH (ref 70–99)
Glucose-Capillary: 228 mg/dL — ABNORMAL HIGH (ref 70–99)
Glucose-Capillary: 62 mg/dL — ABNORMAL LOW (ref 70–99)
Glucose-Capillary: 72 mg/dL (ref 70–99)

## 2022-10-15 LAB — MRSA NEXT GEN BY PCR, NASAL: MRSA by PCR Next Gen: NOT DETECTED

## 2022-10-15 LAB — LACTIC ACID, PLASMA: Lactic Acid, Venous: 1.3 mmol/L (ref 0.5–1.9)

## 2022-10-15 MED ORDER — DEXTROSE IN LACTATED RINGERS 5 % IV SOLN: INTRAVENOUS | Status: DC

## 2022-10-15 MED ORDER — POTASSIUM CHLORIDE 10 MEQ/100ML IV SOLN
10.0000 meq | INTRAVENOUS | Status: AC
  Administered 2022-10-15 (×4): 10 meq via INTRAVENOUS
  Filled 2022-10-15 (×4): qty 100

## 2022-10-15 MED ORDER — DIPHENHYDRAMINE HCL 50 MG/ML IJ SOLN
25.0000 mg | Freq: Once | INTRAMUSCULAR | Status: AC
  Administered 2022-10-15: 25 mg via INTRAVENOUS
  Filled 2022-10-15: qty 1

## 2022-10-15 MED ORDER — PANTOPRAZOLE SODIUM 40 MG IV SOLR
40.0000 mg | INTRAVENOUS | Status: DC
  Administered 2022-10-15 – 2022-10-16 (×2): 40 mg via INTRAVENOUS
  Filled 2022-10-15 (×2): qty 10

## 2022-10-15 MED ORDER — INSULIN DETEMIR 100 UNIT/ML ~~LOC~~ SOLN
20.0000 [IU] | Freq: Two times a day (BID) | SUBCUTANEOUS | Status: DC
  Administered 2022-10-15 – 2022-10-16 (×2): 20 [IU] via SUBCUTANEOUS
  Filled 2022-10-15 (×3): qty 0.2

## 2022-10-15 MED ORDER — DEXTROSE 50 % IV SOLN: 0.0000 mL | INTRAVENOUS | Status: DC | PRN

## 2022-10-15 MED ORDER — POTASSIUM CHLORIDE 10 MEQ/100ML IV SOLN
10.0000 meq | INTRAVENOUS | Status: AC
  Administered 2022-10-15 (×4): 10 meq via INTRAVENOUS
  Filled 2022-10-15 (×2): qty 100

## 2022-10-15 MED ORDER — LACTATED RINGERS IV BOLUS
500.0000 mL | Freq: Once | INTRAVENOUS | Status: AC
  Administered 2022-10-15: 500 mL via INTRAVENOUS

## 2022-10-15 MED ORDER — LACTATED RINGERS IV SOLN: INTRAVENOUS | Status: DC

## 2022-10-15 MED ORDER — METOCLOPRAMIDE HCL 5 MG/ML IJ SOLN
10.0000 mg | Freq: Three times a day (TID) | INTRAMUSCULAR | Status: DC
  Administered 2022-10-15: 10 mg via INTRAVENOUS
  Filled 2022-10-15 (×2): qty 2

## 2022-10-15 MED ORDER — POTASSIUM CHLORIDE 10 MEQ/100ML IV SOLN
10.0000 meq | INTRAVENOUS | Status: AC
  Administered 2022-10-15 (×2): 10 meq via INTRAVENOUS
  Filled 2022-10-15 (×2): qty 100

## 2022-10-15 MED ORDER — ENOXAPARIN SODIUM 40 MG/0.4ML IJ SOSY
40.0000 mg | PREFILLED_SYRINGE | INTRAMUSCULAR | Status: DC
  Administered 2022-10-15 – 2022-10-16 (×2): 40 mg via SUBCUTANEOUS
  Filled 2022-10-15 (×2): qty 0.4

## 2022-10-15 MED ORDER — INSULIN REGULAR(HUMAN) IN NACL 100-0.9 UT/100ML-% IV SOLN
INTRAVENOUS | Status: DC
  Administered 2022-10-15: 5.5 [IU]/h via INTRAVENOUS
  Filled 2022-10-15: qty 100

## 2022-10-15 MED ORDER — METOCLOPRAMIDE HCL 5 MG/ML IJ SOLN
10.0000 mg | Freq: Once | INTRAMUSCULAR | Status: AC
  Administered 2022-10-15: 10 mg via INTRAVENOUS
  Filled 2022-10-15: qty 2

## 2022-10-15 MED ORDER — POTASSIUM CHLORIDE 10 MEQ/100ML IV SOLN: 10.0000 meq | INTRAVENOUS | Status: DC

## 2022-10-15 NOTE — ED Notes (Signed)
Justice notified of the pts glucose. Insulin gtt paused. Pt given orange juice. Pt denies any signs and symptoms of hypoglycemia at this time.

## 2022-10-15 NOTE — Assessment & Plan Note (Signed)
Patient with a history of type 1 diabetes mellitus who presents to the ER for evaluation of abdominal pain, nausea and several episodes of emesis and found to be in DKA with a serum bicarb of 13 and an anion gap of 18 Continue aggressive IV fluid resuscitation Continue insulin drip initiated in the ER Check serial BMP and beta hydroxybutyric acid levels Keep patient n.p.o. for now Symptom control 

## 2022-10-15 NOTE — Progress Notes (Signed)
Patient admitted early this morning for DKA and has been started on IV fluids and insulin drip.  Patient seen and examined at bedside and plan of care discussed with her.  I have reviewed patient medical records, current vitals, labs and medications myself.  Will follow BMP from this morning and if anion gap is closed, will switch to long-acting insulin.

## 2022-10-15 NOTE — ED Notes (Signed)
Insulin was running at 2.4 units/hour prior to dose change

## 2022-10-15 NOTE — ED Notes (Signed)
Pt having abd cramping, she states its a pain level of 1/10 and she vomited after drinking orange juice. Provider aware.

## 2022-10-15 NOTE — Progress Notes (Signed)
       CROSS COVER NOTE  NAME: Krista Gill MRN: [id number] DOB : 2001-02-16 ATTENDING PHYSICIAN: Aline August, MD   Insulin gtt paused for hypoglycemia per Endotool. On review of labs anion gap closed x2 and beta-hydroxybutyrate level is normalizing. Orders placed for Levemir 20U BID, 4U mealtime novolog, meal time SSI, and qHS SSI. q4H accuchecks overnight and then AC/HS.  M(r)s Messman is still experiencing abdominal discomfort and nausea, will continue Clear Liquid Diet. Reglan ordered x1 and patient became agitated, benadryl given.   To reach the provider On-Call:   7AM- 7PM see care teams to locate the attending and reach out to them via [email or web address] Password: [email or web address] 7PM-7AM contact night-coverage If you still have difficulty reaching the appropriate provider, please page the [facility] (Director on Call) for [facility] on amion for assistance  This document was prepared using Systems analyst and may include unintentional dictation errors.  Neomia Glass DNP, MBA, FNP-BC, PMHNP-BC Nurse Practitioner [facility] Pager [phone]

## 2022-10-15 NOTE — Assessment & Plan Note (Signed)
Secondary to GI losses from nausea and vomiting Supplement potassium Check magnesium levels 

## 2022-10-15 NOTE — ED Notes (Signed)
Provider notified of pts BG of 62 on insulin gtt. Insulin gtt was stopped. D5LR started at 125/ hr. Pt given orange juice

## 2022-10-15 NOTE — Inpatient Diabetes Management (Addendum)
Inpatient Diabetes Program Recommendations  AACE/ADA: New Consensus Statement on Inpatient Glycemic Control (2015)  Target Ranges:  Prepandial:   less than 140 mg/dL      Peak postprandial:   less than 180 mg/dL (1-2 hours)      Critically ill patients:  140 - 180 mg/dL   Lab Results  Component Value Date   GLUCAP 152 (H) 10/15/2022   HGBA1C 11.6 (H) 07/24/2022    Latest Reference Range & Units 10/14/22 21:07  Sodium 135 - 145 mmol/L 133 (L)  Potassium 3.5 - 5.1 mmol/L 3.4 (L)  Chloride 98 - 111 mmol/L 102  CO2 22 - 32 mmol/L 13 (L)  Glucose 70 - 99 mg/dL 336 (H)  BUN 6 - 20 mg/dL 14  Creatinine 0.44 - 1.00 mg/dL 0.56  Calcium 8.9 - 10.3 mg/dL 9.3  Anion gap 5 - 15  18 (H)  (L): Data is abnormally low (H): Data is abnormally high  Latest Reference Range & Units 10/15/22 08:09  Sodium 135 - 145 mmol/L 137  Potassium 3.5 - 5.1 mmol/L 3.5  Chloride 98 - 111 mmol/L 108  CO2 22 - 32 mmol/L 17 (L)  Glucose 70 - 99 mg/dL 152 (H)  BUN 6 - 20 mg/dL 9  Creatinine 0.44 - 1.00 mg/dL 0.40 (L)  Calcium 8.9 - 10.3 mg/dL 8.3 (L)  Anion gap 5 - 15  12  (L): Data is abnormally low (H): Data is abnormally high   Diabetes history: DM1(does not make insulin.  Needs correction, basal and meal coverage) Outpatient Diabetes medications: Tresiba 60 untis QAM, Novolog 0-50 units daily (takes 11 units for meal coverage (takes 14 units for meal coverage when on menstrual cycle) plus 1 unit for every 50 mg/dl above target glucose of 150 mg/dl) Endocrinologist in the past was [facility] peds-planned to get local endocrinologist after last discharge. Current Diabetes medications: IV insulin  Inpatient Diabetes Program Recommendations:   DM spoke with patient during last admission on 07/25/22. Patient planned to reach out to a local endocrinologist after discharged. Recommend continuing IV insulin until next beta-hydroxybutyrate results and CO2 is 20.      When criteria is met and MD ready to transition to  SQ insulin, please consider:   -Levemir 20 units BID (give 2 hrs. Prior to IV insulin discontinued) -Novolog 0-9 units TID and 0-5 units QHS (give when IV insulin discontinued) -Novolog 4 units TID with meals   Spoke with patient regarding diabetes management. Gave patient list of endocrinologists in [place] and [place]. Patient plans to go call now for checking on appts available. Patient shared CBGs vary during her menstrual cycle. Requested patient to keep log or take sensor information/glucose meter information to MD appointments for review and compare cycle time with non cycle time.  Thank you, Nani Gasser. Syliva Mee, RN, MSN, CDE  Diabetes Coordinator Inpatient Glycemic Control Team Team Pager [phone] (8am-5pm) 10/15/2022 9:48 AM

## 2022-10-15 NOTE — H&P (Addendum)
History and Physical    Patient: Krista Gill ZDG:[id number] DOB: 09/25/2000 DOA: 10/14/2022 DOS: the patient was seen and examined on 10/15/2022 PCP: Patient, No Pcp Per  Patient coming from: Home  Chief Complaint:  Chief Complaint  Patient presents with   Abdominal Pain   HPI: Krista Gill is a 22 y.o. female with medical history significant for insulin-dependent diabetes mellitus who presents to the emergency room from home for evaluation of abdominal pain mostly in the periumbilical and lower abdominal area which she thinks is related to her menstrual cycle.  She has nausea and has had several episodes of emesis.  States that she has been compliant with her insulin.  She denies having any chest pain, no shortness of breath, no fever, no chills, no headache, no leg swelling, no palpitations, no dizziness, no lightheadedness, no blurred vision no focal deficit. Abnormal labs include beta hydroxybutyric acid 4.37, potassium 3.4, serum bicarb 13, glucose 336, anion gap 18 Patient was started on an insulin drip, potassium supplementation and received IV fluid resuscitation. She will be admitted to the hospital for further evaluation.   Review of Systems: As mentioned in the history of present illness. All other systems reviewed and are negative. Past Medical History:  Diagnosis Date   Diabetes mellitus without complication ([place])    Diabetic ketoacidosis ([place])    Sickle cell trait ([place])    Urinary tract infection    Past Surgical History:  Procedure Laterality Date   ADENOIDECTOMY     MYRINGOTOMY     TONSILLECTOMY     Social History:  reports that she has never smoked. She has been exposed to tobacco smoke. She has never used smokeless tobacco. She reports that she does not drink alcohol and does not use drugs.  Allergies  Allergen Reactions   Pineapple Other (See Comments)    Itchy tongue/throat    Family History  Problem Relation Age of Onset   Sickle cell trait  Mother     Prior to Admission medications   Medication Sig Start Date End Date Taking? Authorizing Provider  cephALEXin (KEFLEX) 500 MG capsule Take 1 capsule (500 mg total) by mouth 3 (three) times daily. 08/20/22   Johnn Hai, PA-C  HYDROcodone-acetaminophen (NORCO/VICODIN) 5-325 MG tablet Take 1 tablet by mouth every 6 (six) hours as needed. 08/20/22 08/20/23  Johnn Hai, PA-C  insulin aspart (NOVOLOG) 100 UNIT/ML FlexPen Inject 15 Units into the skin daily. Sliding Scale  +1 for every 15 over 07/26/22   Lorella Nimrod, MD  insulin degludec (TRESIBA) 100 UNIT/ML FlexTouch Pen Inject 60 Units into the skin daily. 07/26/22   Lorella Nimrod, MD  sulfamethoxazole-trimethoprim (BACTRIM DS) 800-160 MG tablet Take 1 tablet by mouth 2 (two) times daily. 08/20/22   Johnn Hai, PA-C    Physical Exam: Vitals:   10/14/22 2102 10/14/22 2105  BP:  (!) 148/98  Pulse:  82  Resp:  18  Temp:  97.6 F (36.4 C)  TempSrc:  Oral  SpO2:  98%  Weight: 69.4 kg   Height: 4\' 11"  (1.499 m)    Physical Exam Vitals and nursing note reviewed.  Constitutional:      Appearance: She is well-developed.  HENT:     Mouth/Throat:     Comments: Dry Cardiovascular:     Rate and Rhythm: Normal rate and regular rhythm.  Pulmonary:     Effort: Pulmonary effort is normal.     Breath sounds: Normal breath sounds.  Abdominal:  General: Abdomen is flat. Bowel sounds are normal.     Palpations: Abdomen is soft.  Skin:    General: Skin is warm and dry.  Neurological:     General: No focal deficit present.     Mental Status: She is alert.  Psychiatric:        Mood and Affect: Mood normal.        Behavior: Behavior normal.     Data Reviewed: Relevant notes from primary care and specialist visits, past discharge summaries as available in EHR, including Care Everywhere. Prior diagnostic testing as pertinent to current admission diagnoses Updated medications and problem lists for  reconciliation ED course, including vitals, labs, imaging, treatment and response to treatment Triage notes, nursing and pharmacy notes and ED provider's notes Notable results as noted in HPI Labs reviewed.  Beta hydroxybutyric acid 4.37, lactic acid 1.3, lipase 27, sodium 133, potassium 3.4, chloride 102, bicarb 13, glucose 336, BUN 14, creatinine 0.56, calcium 9.3, total protein 8.5, albumin 4.6, AST 21, ALT 15, alkaline phosphatase 74, total bilirubin 1.4, white count 7.3, 10.8, hematocrit 35, platelet count 448 There are no new results to review at this time.  Assessment and Plan: Diabetic ketoacidosis without coma associated with type 1 diabetes mellitus ([place]) Patient with a history of type 1 diabetes mellitus who presents to the ER for evaluation of abdominal pain, nausea and several episodes of emesis and found to be in DKA with a serum bicarb of 13 and an anion gap of 18 Continue aggressive IV fluid resuscitation Continue insulin drip initiated in the ER Check serial BMP and beta hydroxybutyric acid levels Keep patient n.p.o. for now Symptom control  Hypokalemia Secondary to GI losses from nausea and vomiting Supplement potassium  Check magnesium levels      Advance Care Planning:   Code Status: Full Code   Consults: None  Family Communication: Greater than 50% of time was spent discussing patient's condition and plan of care with her grandmother at the bedside.  All questions and concerns have been addressed.  They verbalized understanding and agree with the plan.  Severity of Illness: The appropriate patient status for this patient is INPATIENT. Inpatient status is judged to be reasonable and necessary in order to provide the required intensity of service to ensure the patient's safety. The patient's presenting symptoms, physical exam findings, and initial radiographic and laboratory data in the context of their chronic comorbidities is felt to place them at high risk for  further clinical deterioration. Furthermore, it is not anticipated that the patient will be medically stable for discharge from the hospital within 2 midnights of admission.   * I certify that at the point of admission it is my clinical judgment that the patient will require inpatient hospital care spanning beyond 2 midnights from the point of admission due to high intensity of service, high risk for further deterioration and high frequency of surveillance required.*  Author: Collier Bullock, MD 10/15/2022 12:51 AM  For on call review [email or web address].

## 2022-10-15 NOTE — ED Notes (Signed)
Pt tearful in room. Pt states that it is very frustrating to wind up in the hospital every time that she is on her cycle. Pt states that she checks her sugars regularly but runs into this issue. Pt states she has irregular cycles with bad cramps, which causes her sugars to go up. Pt does not have an endocrinologist, states her dr moved to [facility], then [place]. Pt states the drs in [place] were rude to her, and that she was unable to get in with anyone in the area. Pt wants to get an endocrinologist to prevent from having to coming into the hospital so frequently.

## 2022-10-15 NOTE — ED Notes (Signed)
Provider wants to keep insulin gtt off right now and transition her to basal insulin

## 2022-10-15 NOTE — Assessment & Plan Note (Signed)
Patient with a history of type 1 diabetes mellitus who presents to the ER for evaluation of abdominal pain, nausea and several episodes of emesis and found to be in DKA with a serum bicarb of 13 and an anion gap of 18 Continue aggressive IV fluid resuscitation Continue insulin drip initiated in the ER Check serial BMP and beta hydroxybutyric acid levels Keep patient n.p.o. for now Symptom control

## 2022-10-16 DIAGNOSIS — E1010 Type 1 diabetes mellitus with ketoacidosis without coma: Secondary | ICD-10-CM | POA: Diagnosis present

## 2022-10-16 LAB — BASIC METABOLIC PANEL
Anion gap: 13 (ref 5–15)
Anion gap: 9 (ref 5–15)
BUN: 5 mg/dL — ABNORMAL LOW (ref 6–20)
BUN: 5 mg/dL — ABNORMAL LOW (ref 6–20)
CO2: 16 mmol/L — ABNORMAL LOW (ref 22–32)
CO2: 21 mmol/L — ABNORMAL LOW (ref 22–32)
Calcium: 8.4 mg/dL — ABNORMAL LOW (ref 8.9–10.3)
Calcium: 8.7 mg/dL — ABNORMAL LOW (ref 8.9–10.3)
Chloride: 104 mmol/L (ref 98–111)
Chloride: 105 mmol/L (ref 98–111)
Creatinine, Ser: 0.49 mg/dL (ref 0.44–1.00)
Creatinine, Ser: 0.42 mg/dL — ABNORMAL LOW (ref 0.44–1.00)
GFR, Estimated: >60 mL/min (ref >60)
GFR, Estimated: >60 mL/min (ref >60)
Glucose, Bld: 211 mg/dL — ABNORMAL HIGH (ref 70–99)
Glucose, Bld: 79 mg/dL (ref 70–99)
Potassium: 3.4 mmol/L — ABNORMAL LOW (ref 3.5–5.1)
Potassium: 3.6 mmol/L (ref 3.5–5.1)
Sodium: 133 mmol/L — ABNORMAL LOW (ref 135–145)
Sodium: 135 mmol/L (ref 135–145)

## 2022-10-16 LAB — CBG MONITORING, ED
Glucose-Capillary: 247 mg/dL — ABNORMAL HIGH (ref 70–99)
Glucose-Capillary: 213 mg/dL — ABNORMAL HIGH (ref 70–99)
Glucose-Capillary: 203 mg/dL — ABNORMAL HIGH (ref 70–99)
Glucose-Capillary: 208 mg/dL — ABNORMAL HIGH (ref 70–99)

## 2022-10-16 LAB — COMPREHENSIVE METABOLIC PANEL
ALT: 15 U/L (ref 0–44)
AST: 24 U/L (ref 15–41)
Albumin: 3.7 g/dL (ref 3.5–5.0)
Alkaline Phosphatase: 60 U/L (ref 38–126)
Anion gap: 16 — ABNORMAL HIGH (ref 5–15)
BUN: <5 mg/dL — ABNORMAL LOW (ref 6–20)
CO2: 14 mmol/L — ABNORMAL LOW (ref 22–32)
Calcium: 8.5 mg/dL — ABNORMAL LOW (ref 8.9–10.3)
Chloride: 104 mmol/L (ref 98–111)
Creatinine, Ser: 0.4 mg/dL — ABNORMAL LOW (ref 0.44–1.00)
GFR, Estimated: >60 mL/min (ref >60)
Glucose, Bld: 212 mg/dL — ABNORMAL HIGH (ref 70–99)
Potassium: 3.4 mmol/L — ABNORMAL LOW (ref 3.5–5.1)
Sodium: 134 mmol/L — ABNORMAL LOW (ref 135–145)
Total Bilirubin: 1 mg/dL (ref 0.3–1.2)
Total Protein: 7 g/dL (ref 6.5–8.1)

## 2022-10-16 LAB — BETA-HYDROXYBUTYRIC ACID: Beta-Hydroxybutyric Acid: 3.7 mmol/L — ABNORMAL HIGH (ref 0.05–0.27)

## 2022-10-16 LAB — GLUCOSE, CAPILLARY: Glucose-Capillary: 154 mg/dL — ABNORMAL HIGH (ref 70–99)

## 2022-10-16 LAB — MAGNESIUM: Magnesium: 1.3 mg/dL — ABNORMAL LOW (ref 1.7–2.4)

## 2022-10-16 MED ORDER — POTASSIUM CHLORIDE CRYS ER 20 MEQ PO TBCR
40.0000 meq | EXTENDED_RELEASE_TABLET | Freq: Once | ORAL | Status: AC
  Administered 2022-10-16: 40 meq via ORAL
  Filled 2022-10-16: qty 2

## 2022-10-16 MED ORDER — ALUM & MAG HYDROXIDE-SIMETH 200-200-20 MG/5ML PO SUSP
15.0000 mL | Freq: Once | ORAL | Status: AC | PRN
  Administered 2022-10-16: 15 mL via ORAL
  Filled 2022-10-16: qty 30

## 2022-10-16 MED ORDER — INSULIN ASPART 100 UNIT/ML IJ SOLN
4.0000 [IU] | Freq: Three times a day (TID) | INTRAMUSCULAR | Status: DC
  Administered 2022-10-16 (×2): 4 [IU] via SUBCUTANEOUS
  Filled 2022-10-16 (×2): qty 1

## 2022-10-16 MED ORDER — PANTOPRAZOLE SODIUM 40 MG PO TBEC
40.0000 mg | DELAYED_RELEASE_TABLET | Freq: Every day | ORAL | Status: DC
  Administered 2022-10-16: 40 mg via ORAL
  Filled 2022-10-16: qty 1

## 2022-10-16 MED ORDER — INSULIN ASPART 100 UNIT/ML IJ SOLN
0.0000 [IU] | Freq: Every day | INTRAMUSCULAR | Status: DC
  Administered 2022-10-16: 2 [IU] via SUBCUTANEOUS
  Filled 2022-10-16: qty 1

## 2022-10-16 MED ORDER — INSULIN ASPART 100 UNIT/ML IJ SOLN: 0.0000 [IU] | Freq: Four times a day (QID) | INTRAMUSCULAR | Status: DC

## 2022-10-16 MED ORDER — SODIUM CHLORIDE 0.45 % IV SOLN
INTRAVENOUS | Status: DC
  Filled 2022-10-16 (×2): qty 75

## 2022-10-16 MED ORDER — LACTATED RINGERS IV SOLN: Freq: Once | INTRAVENOUS | Status: AC

## 2022-10-16 MED ORDER — INSULIN DETEMIR 100 UNIT/ML ~~LOC~~ SOLN: 20.0000 [IU] | Freq: Two times a day (BID) | SUBCUTANEOUS | Status: DC

## 2022-10-16 MED ORDER — SODIUM CHLORIDE 0.9 % IV SOLN: INTRAVENOUS | Status: DC

## 2022-10-16 MED ORDER — LIDOCAINE VISCOUS HCL 2 % MT SOLN: 15.0000 mL | Freq: Once | OROMUCOSAL | Status: DC | PRN

## 2022-10-16 MED ORDER — ACETAMINOPHEN 325 MG PO TABS
650.0000 mg | ORAL_TABLET | Freq: Once | ORAL | Status: AC
  Administered 2022-10-16: 650 mg via ORAL
  Filled 2022-10-16: qty 2

## 2022-10-16 MED ORDER — MAGNESIUM SULFATE 4 GM/100ML IV SOLN
4.0000 g | Freq: Once | INTRAVENOUS | Status: AC
  Administered 2022-10-16: 4 g via INTRAVENOUS
  Filled 2022-10-16: qty 100

## 2022-10-16 MED ORDER — INSULIN ASPART 100 UNIT/ML IJ SOLN
0.0000 [IU] | Freq: Three times a day (TID) | INTRAMUSCULAR | Status: DC
  Administered 2022-10-16 (×2): 3 [IU] via SUBCUTANEOUS
  Filled 2022-10-16 (×2): qty 1

## 2022-10-16 NOTE — ED Notes (Signed)
Transport arrived to transport patient. Pt stable at time of departure

## 2022-10-16 NOTE — Progress Notes (Signed)
PHARMACY CONSULT NOTE - FOLLOW UP  Pharmacy Consult for Electrolyte Monitoring and Replacement   Recent Labs: Potassium (mmol/L)  Date Value  10/16/2022 3.6  12/13/2011 5.1 (H)   Magnesium (mg/dL)  Date Value  10/16/2022 1.3 (L)   Calcium (mg/dL)  Date Value  10/16/2022 8.7 (L)   Calcium, Total (mg/dL)  Date Value  12/13/2011 9.6   Albumin (g/dL)  Date Value  10/16/2022 3.7  12/13/2011 4.0   Phosphorus (mg/dL)  Date Value  07/25/2022 2.8   Sodium (mmol/L)  Date Value  10/16/2022 135  12/13/2011 141    Plan:  Electrolytes WNL. No repletion warranted.  Darrick Penna ,PharmD Clinical Pharmacist 10/16/2022 8:35 PM

## 2022-10-16 NOTE — ED Notes (Signed)
This RN assigned self in error

## 2022-10-16 NOTE — ED Notes (Signed)
Called for transport to unit

## 2022-10-16 NOTE — Discharge Instructions (Signed)
Keep log of his sugars at home. Patient advised to establish primary care in the area. She also is recommended to follow-up with endocrinology as soon as possible.

## 2022-10-16 NOTE — Progress Notes (Signed)
Inpatient Diabetes Program Recommendations  AACE/ADA: New Consensus Statement on Inpatient Glycemic Control (2015)  Target Ranges:  Prepandial:   less than 140 mg/dL      Peak postprandial:   less than 180 mg/dL (1-2 hours)      Critically ill patients:  140 - 180 mg/dL   Lab Results  Component Value Date   GLUCAP 208 (H) 10/16/2022   HGBA1C 11.6 (H) 07/24/2022    Review of Glycemic Control  Latest Reference Range & Units 10/15/22 21:29 10/15/22 21:54 10/15/22 23:05 10/16/22 01:05 10/16/22 05:04 10/16/22 07:24 10/16/22 11:27  Glucose-Capillary 70 - 99 mg/dL 72 169 (H) 203 (H) 247 (H) 213 (H) 203 (H) 208 (H)   Diabetes history: DM 1 Outpatient Diabetes medications: Tresiba 60 untis QAM, Novolog 0-50 units daily (takes 11 units for meal coverage (takes 14 units for meal coverage when on menstrual cycle) plus 1 unit for every 50 mg/dl above target glucose of 150 mg/dl) Endocrinologist in the past was [facility] peds-planned to get local endocrinologist after last discharge.  Current orders for Inpatient glycemic control:  Novolog 0-9 units tid with meals and HS Novolog 4 units tid with meals Levemir 20 units bid  Inpatient Diabetes Program Recommendations:    Patient transitioned off insulin drip over night.  Note drop in CO2- MD has increased frequency of Novolog correction to q 4 hours and will reassess labs this afternoon.  Will follow.    Thanks,  Adah Perl, RN, BC-ADM Inpatient Diabetes Coordinator Pager [phone]  (8a-5p)

## 2022-10-16 NOTE — ED Notes (Signed)
Meal tray delivered.

## 2022-10-16 NOTE — ED Notes (Signed)
Pt has not received meal tray at this time. Will wait to give insulin when meal tray is received

## 2022-10-16 NOTE — ED Notes (Signed)
Informed RN bed assigned 

## 2022-10-17 NOTE — Discharge Summary (Signed)
Physician Discharge Summary   Patient: Krista Gill MRN: [id number] DOB: [birth year]  Admit date:     10/14/2022  Discharge date: 10/16/2022  Discharge Physician: Fritzi Mandes   PCP: Patient, No Pcp Per   Recommendations at discharge:    Establish care with Endocrinology as outpt keep log of his sugars at home  Discharge Diagnoses: Principal Problem:   DKA, type 1 ([place]) Active Problems:   Diabetic ketoacidosis without coma associated with type 1 diabetes mellitus ([place])   Hypokalemia  Krista Gill is a 22 y.o. female with medical history significant for insulin-dependent diabetes mellitus who presents to the emergency room from home for evaluation of abdominal pain mostly in the periumbilical and lower abdominal area which she thinks is related to her menstrual cycle.  She has nausea and has had several episodes of emesis.  States that she has been compliant with her insulin  Abnormal labs include beta hydroxybutyric acid 4.37, potassium 3.4, serum bicarb 13, glucose 336, anion gap 18   Diabetic ketoacidosis without coma associated with type 1 diabetes mellitus ([place]) --Patient with a history of type 1 diabetes mellitus who presents to the ER for evaluation of abdominal pain, nausea and several episodes of emesis and found to be in DKA with a serum bicarb of 13 and an anion gap of 18 --Continue aggressive IV fluid resuscitation -- patient received  insulin drip-- she was changed to suck insulin once her anion gap closed -- hemoglobin A1c 15.1% -- patient advised to resume her home dose insulin and sliding scale. She is also advised to get referral to endocrinology to get her sugars under control -- patient received IV fluids and will also receive bicarb IV. -- Patient was desperate to discharge. Since her labs improved discharged. We  did discussed importance of diet and follow-up including insulin.  Hypokalemia Secondary to GI losses from nausea and vomiting Supplement  potassium   Consultants: none Procedures performed: none  Disposition: Home Diet recommendation:  Carb modified diet DISCHARGE MEDICATION: Allergies as of 10/16/2022       Reactions   Pineapple Other (See Comments)   Itchy tongue/throat        Medication List     STOP taking these medications    cephALEXin 500 MG capsule Commonly known as: KEFLEX   sulfamethoxazole-trimethoprim 800-160 MG tablet Commonly known as: BACTRIM DS       TAKE these medications    HYDROcodone-acetaminophen 5-325 MG tablet Commonly known as: NORCO/VICODIN Take 1 tablet by mouth every 6 (six) hours as needed.   insulin aspart 100 UNIT/ML FlexPen Commonly known as: NOVOLOG Inject 15 Units into the skin daily. Sliding Scale  +1 for every 15 over   insulin degludec 100 UNIT/ML FlexTouch Pen Commonly known as: TRESIBA Inject 60 Units into the skin daily.        Filed Weights   10/14/22 2102  Weight: 69.4 kg     Condition at discharge: fair  The results of significant diagnostics from this hospitalization (including imaging, microbiology, ancillary and laboratory) are listed below for reference.   Imaging Studies: No results found.  Microbiology: Results for orders placed or performed during the hospital encounter of 10/14/22  MRSA Next Gen by PCR, Nasal     Status: None   Collection Time: 10/15/22  6:40 PM   Specimen: Nasal Mucosa; Nasal Swab  Result Value Ref Range Status   MRSA by PCR Next Gen NOT DETECTED NOT DETECTED Final    Comment: (NOTE) The  GeneXpert MRSA Assay (FDA approved for NASAL specimens only), is one component of a comprehensive MRSA colonization surveillance program. It is not intended to diagnose MRSA infection nor to guide or monitor treatment for MRSA infections. Test performance is not FDA approved in patients less than 24 years old. Performed at [facility], [address] [place], [place] [address]     Labs: CBC: Recent Labs  Lab  10/14/22 2107  WBC 10.2  HGB 10.8*  HCT 35.0*  MCV 67.0*  PLT 144*   Basic Metabolic Panel: Recent Labs  Lab 10/15/22 1628 10/15/22 2011 10/16/22 0525 10/16/22 1129 10/16/22 1724  NA 134* 135 134* 133* 135  K 3.5 3.3* 3.4* 3.4* 3.6  CL 105 107 104 104 105  CO2 19* 20* 14* 16* 21*  GLUCOSE 189* 222* 212* 211* 79  BUN 7 6 <5* 5* 5*  CREATININE 0.42* 0.42* 0.40* 0.49 0.42*  CALCIUM 8.8* 8.6* 8.5* 8.4* 8.7*  MG  --   --  1.3*  --   --    Liver Function Tests: Recent Labs  Lab 10/14/22 2107 10/16/22 0525  AST 21 24  ALT 15 15  ALKPHOS 74 60  BILITOT 1.4* 1.0  PROT 8.5* 7.0  ALBUMIN 4.6 3.7   CBG: Recent Labs  Lab 10/16/22 0105 10/16/22 0504 10/16/22 0724 10/16/22 1127 10/16/22 1400  GLUCAP 247* 213* 203* 208* 154*    Discharge time spent: greater than 30 minutes.  Signed: Fritzi Mandes, MD [facility] 10/17/2022

## 2022-11-01 ENCOUNTER — Encounter: Payer: Self-pay | Admitting: Family Medicine

## 2022-11-01 ENCOUNTER — Ambulatory Visit (LOCAL_COMMUNITY_HEALTH_CENTER): Payer: Medicaid Other | Admitting: Family Medicine

## 2022-11-01 VITALS — BP 104/71 | HR 99 | Ht 59.0 in | Wt 164.8 lb

## 2022-11-01 DIAGNOSIS — Z3009 Encounter for other general counseling and advice on contraception: Secondary | ICD-10-CM

## 2022-11-01 DIAGNOSIS — Z01419 Encounter for gynecological examination (general) (routine) without abnormal findings: Secondary | ICD-10-CM

## 2022-11-01 DIAGNOSIS — Z3202 Encounter for pregnancy test, result negative: Secondary | ICD-10-CM

## 2022-11-01 DIAGNOSIS — Z309 Encounter for contraceptive management, unspecified: Secondary | ICD-10-CM

## 2022-11-01 LAB — PREGNANCY, URINE: Preg Test, Ur: NEGATIVE

## 2022-11-01 MED ORDER — NORETHINDRONE 0.35 MG PO TABS: 1.0000 | ORAL_TABLET | Freq: Every day | ORAL | 12 refills | Status: DC

## 2022-11-01 NOTE — Progress Notes (Signed)
[facility] [place] Number: [phone]  Family Planning Visit- Initial Visit  Subjective:  Krista Gill is a 22 y.o.  [id number]   being seen today for an initial annual visit and to discuss reproductive life planning.  The patient is currently using No Method - Other Reason for pregnancy prevention. Patient reports   does not want a pregnancy in the next year.     report they are looking for a method that provides Does not want something inserted  Patient has the following medical conditions has Adjustment reaction of adolescence; Non compliance w medication regimen; Type 1 diabetes mellitus with ketoacidosis, uncontrolled ([place]); Dehydration; Ketonuria; Inadequate parental supervision and control; DKA (diabetic ketoacidoses); Poor compliance; Diabetic ketoacidosis without coma associated with type 1 diabetes mellitus ([place]); Hypokalemia; Leukocytosis; Cellulitis of left ankle; Overweight (BMI 25.0-29.9); and DKA, type 1 ([place]) on their problem list.  Chief Complaint  Patient presents with   Annual Exam    PE and BC.  Wants OCP's    Patient reports to clinic for PE and OCPS  Body mass index is 33.29 kg/m. - Patient is eligible for diabetes screening based on BMI> 25 and age >35?  no HA1C ordered? not applicable  Patient reports 1  partner/s in last year. Desires STI screening?  No - declined  Has patient been screened once for HCV in the past?  No  No results found for: "HCVAB"  Does the patient have current drug use (including MJ), have a partner with drug use, and/or has been incarcerated since last result? No  If yes-- Screen for HCV through [facility]   Does the patient meet criteria for HBV testing? No  Criteria:  -Household, sexual or needle sharing contact with HBV -History of drug use -HIV positive -Those with known Hep C   Health Maintenance Due  Topic Date Due   COVID-19 Vaccine (1) Never  done   FOOT EXAM  Never done   OPHTHALMOLOGY EXAM  Never done   HPV VACCINES (1 - 2-dose series) Never done   Diabetic kidney evaluation - Urine ACR  Never done   Hepatitis C Screening  Never done   INFLUENZA VACCINE  Never done   CHLAMYDIA SCREENING  06/14/2022   DTaP/Tdap/Td (1 - Tdap) Never done    Review of Systems  Constitutional:  Negative for weight loss.  Eyes:  Negative for blurred vision.  Respiratory:  Negative for cough and shortness of breath.   Cardiovascular:  Negative for claudication.  Gastrointestinal:  Negative for nausea.  Genitourinary:  Negative for dysuria and frequency.  Skin:  Negative for rash.  Neurological:  Negative for headaches.  Endo/Heme/Allergies:  Does not bruise/bleed easily.    The following portions of the patient's history were reviewed and updated as appropriate: allergies, current medications, past family history, past medical history, past social history, past surgical history and problem list. Problem list updated.   See flowsheet for other program required questions.  Objective:   Vitals:   11/01/22 1605  BP: 104/71  Pulse: 99  Weight: 164 lb 12.8 oz (74.8 kg)  Height: '4\' 11"'$  (1.499 m)    Physical Exam Vitals and nursing note reviewed.  Constitutional:      Appearance: Normal appearance.  HENT:     Head: Normocephalic and atraumatic.     Mouth/Throat:     Mouth: Mucous membranes are moist.     Pharynx: Oropharynx is clear. No oropharyngeal  exudate or posterior oropharyngeal erythema.  Pulmonary:     Effort: Pulmonary effort is normal.  Abdominal:     General: Abdomen is flat.     Palpations: There is no mass.     Tenderness: There is no abdominal tenderness. There is no rebound.  Genitourinary:    Comments: Declined genital exam- no concerns Lymphadenopathy:     Head:     Right side of head: No preauricular or posterior auricular adenopathy.     Left side of head: No preauricular or posterior auricular adenopathy.      Cervical: No cervical adenopathy.     Upper Body:     Right upper body: No supraclavicular, axillary or epitrochlear adenopathy.     Left upper body: No supraclavicular, axillary or epitrochlear adenopathy.  Skin:    General: Skin is warm and dry.     Findings: No rash.  Neurological:     Mental Status: She is alert and oriented to person, place, and time.    Assessment and Plan:  Krista Gill is a 22 y.o. female presenting to the [facility] Department for an initial annual wellness/contraceptive visit  Contraception counseling: Reviewed options based on patient desire and reproductive life plan. Patient is interested in Oral Contraceptive. This was provided to the patient today.   Risks, benefits, and typical effectiveness rates were reviewed.  Questions were answered.  Written information was also given to the patient to review.    The patient will follow up in  1 years for surveillance.  The patient was told to call with any further questions, or with any concerns about this method of contraception.  Emphasized use of condoms 100% of the time for STI prevention.  Need for ECP was assessed. Patient reported Unprotected sex within past 120 hours.  Reviewed options and patient desired No method of ECP, declined all    1. Well woman exam with routine gynecological exam -CBE deferred until 25 per ACOG guidelines -Pap test not indicated until 21 -pt has extensive hx of DM and comorbidities, states she is going to see a new doc in March  2. Family planning -recent sex 10/30/22 without condom, LMP 2/5 -reviewed patients medical hx- and per [facility] guidelines it appears that COCs may be contraindicated- as well as DMPA, I discussed micronor and Nexplanon with her. She decided to try Micronor for now and may come back later for Nexplanon.   Return if symptoms worsen or fail to improve.  No future appointments.  Sharlet Salina, [place]

## 2022-11-01 NOTE — Progress Notes (Signed)
Pt is here for PE and BC.  Pt notified of negative UPT and given FP packet.  Windle Guard, RN

## 2022-11-01 NOTE — Addendum Note (Signed)
Addended by: Sharlet Salina on: 11/01/2022 04:57 PM   Modules accepted: Orders

## 2023-06-04 ENCOUNTER — Inpatient Hospital Stay
Admission: EM | Admit: 2023-06-04 | Discharge: 2023-06-05 | DRG: 639 | Disposition: A | Discharge status: Home / Self Care | Payer: Medicaid Other | Attending: Internal Medicine | Admitting: Internal Medicine

## 2023-06-04 ENCOUNTER — Other Ambulatory Visit: Payer: Self-pay

## 2023-06-04 DIAGNOSIS — E860 Dehydration: Secondary | ICD-10-CM | POA: Diagnosis present

## 2023-06-04 DIAGNOSIS — E1310 Other specified diabetes mellitus with ketoacidosis without coma: Secondary | ICD-10-CM | POA: Diagnosis not present

## 2023-06-04 DIAGNOSIS — E1110 Type 2 diabetes mellitus with ketoacidosis without coma: Secondary | ICD-10-CM | POA: Diagnosis present

## 2023-06-04 DIAGNOSIS — Z833 Family history of diabetes mellitus: Secondary | ICD-10-CM | POA: Diagnosis not present

## 2023-06-04 DIAGNOSIS — F1210 Cannabis abuse, uncomplicated: Secondary | ICD-10-CM | POA: Diagnosis not present

## 2023-06-04 DIAGNOSIS — E1010 Type 1 diabetes mellitus with ketoacidosis without coma: Principal | ICD-10-CM | POA: Diagnosis present

## 2023-06-04 DIAGNOSIS — F1290 Cannabis use, unspecified, uncomplicated: Secondary | ICD-10-CM

## 2023-06-04 DIAGNOSIS — Z794 Long term (current) use of insulin: Secondary | ICD-10-CM | POA: Diagnosis not present

## 2023-06-04 DIAGNOSIS — Z91018 Allergy to other foods: Secondary | ICD-10-CM | POA: Diagnosis not present

## 2023-06-04 DIAGNOSIS — Z832 Family history of diseases of the blood and blood-forming organs and certain disorders involving the immune mechanism: Secondary | ICD-10-CM

## 2023-06-04 DIAGNOSIS — D573 Sickle-cell trait: Secondary | ICD-10-CM | POA: Diagnosis present

## 2023-06-04 LAB — BASIC METABOLIC PANEL
Anion gap: 17 — ABNORMAL HIGH (ref 5–15)
Anion gap: 11 (ref 5–15)
Anion gap: 11 (ref 5–15)
BUN: 12 mg/dL (ref 6–20)
BUN: 9 mg/dL (ref 6–20)
BUN: 8 mg/dL (ref 6–20)
CO2: 15 mmol/L — ABNORMAL LOW (ref 22–32)
CO2: 20 mmol/L — ABNORMAL LOW (ref 22–32)
CO2: 22 mmol/L (ref 22–32)
Calcium: 9.1 mg/dL (ref 8.9–10.3)
Calcium: 8.4 mg/dL — ABNORMAL LOW (ref 8.9–10.3)
Calcium: 8.5 mg/dL — ABNORMAL LOW (ref 8.9–10.3)
Chloride: 98 mmol/L (ref 98–111)
Chloride: 104 mmol/L (ref 98–111)
Chloride: 101 mmol/L (ref 98–111)
Creatinine, Ser: 0.73 mg/dL (ref 0.44–1.00)
Creatinine, Ser: 0.44 mg/dL (ref 0.44–1.00)
Creatinine, Ser: 0.42 mg/dL — ABNORMAL LOW (ref 0.44–1.00)
GFR, Estimated: >60 mL/min (ref >60)
GFR, Estimated: >60 mL/min (ref >60)
GFR, Estimated: >60 mL/min (ref >60)
Glucose, Bld: 383 mg/dL — ABNORMAL HIGH (ref 70–99)
Glucose, Bld: 123 mg/dL — ABNORMAL HIGH (ref 70–99)
Glucose, Bld: 114 mg/dL — ABNORMAL HIGH (ref 70–99)
Potassium: 4 mmol/L (ref 3.5–5.1)
Potassium: 3.5 mmol/L (ref 3.5–5.1)
Potassium: 3.2 mmol/L — ABNORMAL LOW (ref 3.5–5.1)
Sodium: 130 mmol/L — ABNORMAL LOW (ref 135–145)
Sodium: 135 mmol/L (ref 135–145)
Sodium: 134 mmol/L — ABNORMAL LOW (ref 135–145)

## 2023-06-04 LAB — CBC
HCT: 35.3 % — ABNORMAL LOW (ref 36.0–46.0)
Hemoglobin: 10.5 g/dL — ABNORMAL LOW (ref 12.0–15.0)
MCH: 19 pg — ABNORMAL LOW (ref 26.0–34.0)
MCHC: 29.7 g/dL — ABNORMAL LOW (ref 30.0–36.0)
MCV: 63.7 fL — ABNORMAL LOW (ref 80.0–100.0)
Platelets: 442 10*3/uL — ABNORMAL HIGH (ref 150–400)
RBC: 5.54 MIL/uL — ABNORMAL HIGH (ref 3.87–5.11)
RDW: 18.7 % — ABNORMAL HIGH (ref 11.5–15.5)
WBC: 6.7 10*3/uL (ref 4.0–10.5)
nRBC: 0 % (ref 0.0–0.2)

## 2023-06-04 LAB — GLUCOSE, CAPILLARY
Glucose-Capillary: 172 mg/dL — ABNORMAL HIGH (ref 70–99)
Glucose-Capillary: 126 mg/dL — ABNORMAL HIGH (ref 70–99)
Glucose-Capillary: 125 mg/dL — ABNORMAL HIGH (ref 70–99)
Glucose-Capillary: 109 mg/dL — ABNORMAL HIGH (ref 70–99)
Glucose-Capillary: 104 mg/dL — ABNORMAL HIGH (ref 70–99)
Glucose-Capillary: 111 mg/dL — ABNORMAL HIGH (ref 70–99)
Glucose-Capillary: 115 mg/dL — ABNORMAL HIGH (ref 70–99)
Glucose-Capillary: 111 mg/dL — ABNORMAL HIGH (ref 70–99)

## 2023-06-04 LAB — BLOOD GAS, VENOUS
Acid-base deficit: 6.9 mmol/L — ABNORMAL HIGH (ref 0.0–2.0)
Bicarbonate: 17.3 mmol/L — ABNORMAL LOW (ref 20.0–28.0)
O2 Saturation: 94.1 %
Patient temperature: 37
pCO2, Ven: 30 mmHg — ABNORMAL LOW (ref 44–60)
pH, Ven: 7.37 (ref 7.25–7.43)
pO2, Ven: 68 mmHg — ABNORMAL HIGH (ref 32–45)

## 2023-06-04 LAB — URINALYSIS, ROUTINE W REFLEX MICROSCOPIC
Bilirubin Urine: NEGATIVE
Glucose, UA: >=500 mg/dL — AB
Hgb urine dipstick: NEGATIVE
Ketones, ur: 80 mg/dL — AB
Leukocytes,Ua: NEGATIVE
Nitrite: NEGATIVE
Protein, ur: NEGATIVE mg/dL
Specific Gravity, Urine: 1.026 (ref 1.005–1.030)
pH: 5 (ref 5.0–8.0)

## 2023-06-04 LAB — MAGNESIUM: Magnesium: 1.5 mg/dL — ABNORMAL LOW (ref 1.7–2.4)

## 2023-06-04 LAB — BETA-HYDROXYBUTYRIC ACID
Beta-Hydroxybutyric Acid: 5.88 mmol/L — ABNORMAL HIGH (ref 0.05–0.27)
Beta-Hydroxybutyric Acid: 2.03 mmol/L — ABNORMAL HIGH (ref 0.05–0.27)

## 2023-06-04 LAB — HEMOGLOBIN A1C
Hgb A1c MFr Bld: 12.4 % — ABNORMAL HIGH (ref 4.8–5.6)
Mean Plasma Glucose: 309.18 mg/dL

## 2023-06-04 LAB — PHOSPHORUS: Phosphorus: 2.8 mg/dL (ref 2.5–4.6)

## 2023-06-04 LAB — CBG MONITORING, ED
Glucose-Capillary: 364 mg/dL — ABNORMAL HIGH (ref 70–99)
Glucose-Capillary: 250 mg/dL — ABNORMAL HIGH (ref 70–99)

## 2023-06-04 LAB — MRSA NEXT GEN BY PCR, NASAL: MRSA by PCR Next Gen: NOT DETECTED

## 2023-06-04 LAB — POC URINE PREG, ED: Preg Test, Ur: NEGATIVE

## 2023-06-04 MED ORDER — INSULIN DETEMIR 100 UNIT/ML ~~LOC~~ SOLN
0.1500 [IU]/kg | SUBCUTANEOUS | Status: DC
  Filled 2023-06-04: qty 0.1

## 2023-06-04 MED ORDER — INSULIN REGULAR(HUMAN) IN NACL 100-0.9 UT/100ML-% IV SOLN
INTRAVENOUS | Status: DC
  Administered 2023-06-04: 0.4 [IU]/h via INTRAVENOUS

## 2023-06-04 MED ORDER — POTASSIUM CHLORIDE 10 MEQ/100ML IV SOLN
10.0000 meq | INTRAVENOUS | Status: AC
  Administered 2023-06-04 (×2): 10 meq via INTRAVENOUS
  Filled 2023-06-04 (×3): qty 100

## 2023-06-04 MED ORDER — INSULIN ASPART 100 UNIT/ML IJ SOLN: 0.0000 [IU] | Freq: Three times a day (TID) | INTRAMUSCULAR | Status: DC

## 2023-06-04 MED ORDER — SODIUM CHLORIDE 0.9 % IV SOLN: Freq: Once | INTRAVENOUS | Status: AC

## 2023-06-04 MED ORDER — POTASSIUM CHLORIDE 10 MEQ/100ML IV SOLN: 10.0000 meq | INTRAVENOUS | Status: DC

## 2023-06-04 MED ORDER — INSULIN ASPART 100 UNIT/ML IJ SOLN
4.0000 [IU] | Freq: Three times a day (TID) | INTRAMUSCULAR | Status: DC
  Administered 2023-06-05: 4 [IU] via SUBCUTANEOUS
  Filled 2023-06-04: qty 1

## 2023-06-04 MED ORDER — LACTATED RINGERS IV SOLN: INTRAVENOUS | Status: DC

## 2023-06-04 MED ORDER — LACTATED RINGERS IV BOLUS: 20.0000 mL/kg | Freq: Once | INTRAVENOUS | Status: DC

## 2023-06-04 MED ORDER — INSULIN DEGLUDEC 100 UNIT/ML ~~LOC~~ SOPN: 30.0000 [IU] | PEN_INJECTOR | Freq: Every day | SUBCUTANEOUS | Status: DC

## 2023-06-04 MED ORDER — POTASSIUM CHLORIDE CRYS ER 20 MEQ PO TBCR
40.0000 meq | EXTENDED_RELEASE_TABLET | Freq: Once | ORAL | Status: AC
  Administered 2023-06-04: 40 meq via ORAL
  Filled 2023-06-04: qty 2

## 2023-06-04 MED ORDER — INSULIN ASPART 100 UNIT/ML IJ SOLN: 3.0000 [IU] | Freq: Three times a day (TID) | INTRAMUSCULAR | Status: DC

## 2023-06-04 MED ORDER — DEXTROSE 50 % IV SOLN: 0.0000 mL | INTRAVENOUS | Status: DC | PRN

## 2023-06-04 MED ORDER — MAGNESIUM SULFATE 4 GM/100ML IV SOLN
4.0000 g | Freq: Once | INTRAVENOUS | Status: AC
  Administered 2023-06-05: 4 g via INTRAVENOUS
  Filled 2023-06-04: qty 100

## 2023-06-04 MED ORDER — DEXTROSE IN LACTATED RINGERS 5 % IV SOLN: INTRAVENOUS | Status: DC

## 2023-06-04 MED ORDER — INSULIN REGULAR(HUMAN) IN NACL 100-0.9 UT/100ML-% IV SOLN
INTRAVENOUS | Status: DC
  Administered 2023-06-04: 7.5 [IU]/h via INTRAVENOUS
  Filled 2023-06-04: qty 100

## 2023-06-04 MED ORDER — CHLORHEXIDINE GLUCONATE CLOTH 2 % EX PADS
6.0000 | MEDICATED_PAD | Freq: Every day | CUTANEOUS | Status: DC
  Administered 2023-06-04: 6 via TOPICAL

## 2023-06-04 MED ORDER — ONDANSETRON HCL 4 MG PO TABS: 4.0000 mg | ORAL_TABLET | Freq: Four times a day (QID) | ORAL | Status: DC | PRN

## 2023-06-04 MED ORDER — INSULIN GLARGINE-YFGN 100 UNIT/ML ~~LOC~~ SOLN
30.0000 [IU] | Freq: Every day | SUBCUTANEOUS | Status: DC
  Administered 2023-06-04: 30 [IU] via SUBCUTANEOUS
  Filled 2023-06-04: qty 0.3

## 2023-06-04 MED ORDER — ENOXAPARIN SODIUM 40 MG/0.4ML IJ SOSY: 40.0000 mg | PREFILLED_SYRINGE | INTRAMUSCULAR | Status: DC

## 2023-06-04 MED ORDER — INSULIN ASPART 100 UNIT/ML IJ SOLN: 0.0000 [IU] | Freq: Every day | INTRAMUSCULAR | Status: DC

## 2023-06-04 MED ORDER — ONDANSETRON HCL 4 MG/2ML IJ SOLN: 4.0000 mg | Freq: Four times a day (QID) | INTRAMUSCULAR | Status: DC | PRN

## 2023-06-04 NOTE — Assessment & Plan Note (Signed)
Blood sugar 3-400s with overlapping ketoacidosis in the setting of missed long-acting insulin dosing Start DKA protocol IV fluid hydration Monitor

## 2023-06-04 NOTE — Assessment & Plan Note (Signed)
Clinically dry in the setting of DKA IV fluid hydration per DKA protocol Monitor

## 2023-06-04 NOTE — Plan of Care (Signed)
  Problem: Education: Goal: Ability to describe self-care measures that may prevent or decrease complications (Diabetes Survival Skills Education) will improve Outcome: Progressing Goal: Individualized Educational Video(s) Outcome: Progressing   

## 2023-06-04 NOTE — Assessment & Plan Note (Signed)
Regular heavy marijuana use per patient Discussed cessation Monitor

## 2023-06-04 NOTE — H&P (Addendum)
History and Physical    Patient: Krista Gill:[id number] DOB: Feb 12, 2001 DOA: 06/04/2023 DOS: the patient was seen and examined on 06/04/2023 PCP: Patient, No Pcp Per  Patient coming from: Home  Chief Complaint:  Chief Complaint  Patient presents with   Hyperglycemia    Patient went to [facility] to get a refill on her Evaristo Bury but was sent here for hyplerglycemia and ketones in urine (CBG 364 here); Patient does not have any complaints and overall feels well; She has not missed any doses of her Evaristo Bury and ran out this morning   HPI: Krista Gill is a 22 y.o. female with medical history significant of type 1 diabetes, sickle cell trait, marijuana use presenting with DKA.  Patient reports running out of her Evaristo Bury over the past 24 hours.  Patient has endocrinology follow-up in November for the patient.  Has had difficulty with medication refills.  Patient states she has regular checks of blood sugars daily, however actual numbers are not clear.  No abdominal pain, nausea or vomiting.  No chest pain or shortness of breath.  Smoking 1-2 large joints of marijuana daily.  No reported alcohol use.  No fevers or chills. Presented to the ER afebrile, hemodynamically stable.  White count 6.7, hemoglobin 10.5, platelets 442, creatinine 0.73, glucose 383, bicarb 15.  BHB 5.9.  VBG stable.   Review of Systems: As mentioned in the history of present illness. All other systems reviewed and are negative. Past Medical History:  Diagnosis Date   Diabetes mellitus without complication (HCC)    Diabetic ketoacidosis (HCC)    Sickle cell trait (HCC)    Urinary tract infection    Past Surgical History:  Procedure Laterality Date   ADENOIDECTOMY     MYRINGOTOMY     TONSILLECTOMY     Social History:  reports that she has never smoked. She has been exposed to tobacco smoke. She has never used smokeless tobacco. She reports that she does not drink alcohol and does not use drugs.  Allergies   Allergen Reactions   Pineapple Other (See Comments)    Itchy tongue/throat    Family History  Problem Relation Age of Onset   Sickle cell trait Mother    Diabetes Paternal Grandmother    Thyroid disease Paternal Uncle     Prior to Admission medications   Medication Sig Start Date End Date Taking? Authorizing Provider  insulin aspart (NOVOLOG) 100 UNIT/ML FlexPen Inject 15 Units into the skin daily. Sliding Scale  +1 for every 15 over 07/26/22  Yes Arnetha Courser, MD  insulin degludec (TRESIBA) 100 UNIT/ML FlexTouch Pen Inject 60 Units into the skin daily. 07/26/22  Yes Arnetha Courser, MD  norethindrone (ORTHO MICRONOR) 0.35 MG tablet Take 1 tablet (0.35 mg total) by mouth daily. 11/01/22  Yes Lenice Llamas, FNP  HYDROcodone-acetaminophen (NORCO/VICODIN) 5-325 MG tablet Take 1 tablet by mouth every 6 (six) hours as needed. Patient not taking: Reported on 11/01/2022 08/20/22 08/20/23  Tommi Rumps, PA-C    Physical Exam: Vitals:   06/04/23 1113 06/04/23 1118 06/04/23 1435  BP: 127/87    Pulse: 97    Resp: 18    Temp: 97.9 F (36.6 C)  97.9 F (36.6 C)  TempSrc: Oral  Oral  SpO2: 100%    Weight:  73 kg   Height:  4\' 11"  (1.499 m) 4\' 11"  (1.499 m)   Physical Exam Constitutional:      Appearance: She is normal weight.  HENT:  Head: Normocephalic and atraumatic.     Nose: Nose normal.     Mouth/Throat:     Mouth: Mucous membranes are moist.  Eyes:     Extraocular Movements: Extraocular movements intact.     Pupils: Pupils are equal, round, and reactive to light.  Cardiovascular:     Rate and Rhythm: Normal rate and regular rhythm.  Pulmonary:     Effort: Pulmonary effort is normal.  Abdominal:     General: Bowel sounds are normal.  Musculoskeletal:        General: Normal range of motion.     Cervical back: Normal range of motion.  Neurological:     General: No focal deficit present.  Psychiatric:        Mood and Affect: Mood normal.     Data  Reviewed:  There are no new results to review at this time.  DG Chest 1 View CLINICAL DATA:  Sickle cell disease, DKA, emesis  EXAM: CHEST  1 VIEW  COMPARISON:  12/02/2021  FINDINGS: Single frontal view of the chest demonstrates an unremarkable cardiac silhouette. No airspace disease, effusion, or pneumothorax. No acute bony abnormalities.  IMPRESSION: 1. No acute intrathoracic process.  Electronically Signed   By: Sharlet Salina M.D.   On: 07/24/2022 20:41  Lab Results  Component Value Date   WBC 6.7 06/04/2023   HGB 10.5 (L) 06/04/2023   HCT 35.3 (L) 06/04/2023   MCV 63.7 (L) 06/04/2023   PLT 442 (H) 06/04/2023   Last metabolic panel Lab Results  Component Value Date   GLUCOSE 383 (H) 06/04/2023   NA 130 (L) 06/04/2023   K 4.0 06/04/2023   CL 98 06/04/2023   CO2 15 (L) 06/04/2023   BUN 12 06/04/2023   CREATININE 0.73 06/04/2023   GFRNONAA >60 06/04/2023   CALCIUM 9.1 06/04/2023   PHOS 2.8 07/25/2022   PROT 7.0 10/16/2022   ALBUMIN 3.7 10/16/2022   BILITOT 1.0 10/16/2022   ALKPHOS 60 10/16/2022   AST 24 10/16/2022   ALT 15 10/16/2022   ANIONGAP 17 (H) 06/04/2023    Assessment and Plan: Diabetic ketoacidosis without coma associated with type 1 diabetes mellitus (HCC) Blood sugar 3-400s with overlapping ketoacidosis in the setting of missed long-acting insulin dosing Start DKA protocol IV fluid hydration Monitor   Dehydration Clinically dry in the setting of DKA IV fluid hydration per DKA protocol Monitor  Marijuana use Regular heavy marijuana use per patient Discussed cessation Monitor    Greater than 50% was spent in counseling and coordination of care with patient Total encounter time 80 minutes or more   Advance Care Planning:   Code Status: Full Code   Consults: None   Family Communication: No family at the bedside   Severity of Illness: The appropriate patient status for this patient is INPATIENT. Inpatient status is judged to  be reasonable and necessary in order to provide the required intensity of service to ensure the patient's safety. The patient's presenting symptoms, physical exam findings, and initial radiographic and laboratory data in the context of their chronic comorbidities is felt to place them at high risk for further clinical deterioration. Furthermore, it is not anticipated that the patient will be medically stable for discharge from the hospital within 2 midnights of admission.   * I certify that at the point of admission it is my clinical judgment that the patient will require inpatient hospital care spanning beyond 2 midnights from the point of admission due to high intensity of  service, high risk for further deterioration and high frequency of surveillance required.*  Author: Floydene Flock, MD 06/04/2023 2:43 PM  For on call review [email or web address].

## 2023-06-04 NOTE — ED Provider Notes (Signed)
[facility] Provider Note    Event Date/Time   First MD Initiated Contact with Patient 06/04/23 1138     (approximate)   History   Hyperglycemia  HPI  Krista Gill is a 22 y.o. female with history of diabetes who presents with complaints of hyperglycemia, urgent care noted ketones in the urine.  Patient reports mild nausea no vomiting, ran out of her Evaristo Bury yesterday     Physical Exam   Triage Vital Signs: ED Triage Vitals  Encounter Vitals Group     BP 06/04/23 1113 127/87     Systolic BP Percentile --      Diastolic BP Percentile --      Pulse Rate 06/04/23 1113 97     Resp 06/04/23 1113 18     Temp 06/04/23 1113 97.9 F (36.6 C)     Temp Source 06/04/23 1113 Oral     SpO2 06/04/23 1113 100 %     Weight 06/04/23 1118 73 kg (161 lb)     Height 06/04/23 1118 1.499 m (4\' 11" )     Head Circumference --      Peak Flow --      Pain Score 06/04/23 1120 0     Pain Loc --      Pain Education --      Exclude from Growth Chart --     Most recent vital signs: Vitals:   06/04/23 1113  BP: 127/87  Pulse: 97  Resp: 18  Temp: 97.9 F (36.6 C)  SpO2: 100%     General: Awake, no distress.  CV:  Good peripheral perfusion.  Resp:  Normal effort.  Abd:  No distention.  Soft nontender Other:     ED Results / Procedures / Treatments   Labs (all labs ordered are listed, but only abnormal results are displayed) Labs Reviewed  CBC - Abnormal; Notable for the following components:      Result Value   RBC 5.54 (*)    Hemoglobin 10.5 (*)    HCT 35.3 (*)    MCV 63.7 (*)    MCH 19.0 (*)    MCHC 29.7 (*)    RDW 18.7 (*)    Platelets 442 (*)    All other components within normal limits  BASIC METABOLIC PANEL - Abnormal; Notable for the following components:   Sodium 130 (*)    CO2 15 (*)    Glucose, Bld 383 (*)    Anion gap 17 (*)    All other components within normal limits  URINALYSIS, ROUTINE W REFLEX MICROSCOPIC - Abnormal;  Notable for the following components:   Color, Urine STRAW (*)    APPearance CLEAR (*)    Glucose, UA >=500 (*)    Ketones, ur 80 (*)    Bacteria, UA RARE (*)    All other components within normal limits  BLOOD GAS, VENOUS - Abnormal; Notable for the following components:   pCO2, Ven 30 (*)    pO2, Ven 68 (*)    Bicarbonate 17.3 (*)    Acid-base deficit 6.9 (*)    All other components within normal limits  BETA-HYDROXYBUTYRIC ACID - Abnormal; Notable for the following components:   Beta-Hydroxybutyric Acid 5.88 (*)    All other components within normal limits  CBG MONITORING, ED - Abnormal; Notable for the following components:   Glucose-Capillary 364 (*)    All other components within normal limits  CBG MONITORING, ED - Abnormal; Notable for the following  components:   Glucose-Capillary 250 (*)    All other components within normal limits  POC URINE PREG, ED     EKG     RADIOLOGY     PROCEDURES:  Critical Care performed: yes CRITICAL CARE Performed by: Jene Every   Total critical care time:  30 minutes  Critical care time was exclusive of separately billable procedures and treating other patients.  Critical care was necessary to treat or prevent imminent or life-threatening deterioration.  Critical care was time spent personally by me on the following activities: development of treatment plan with patient and/or surrogate as well as nursing, discussions with consultants, evaluation of patient's response to treatment, examination of patient, obtaining history from patient or surrogate, ordering and performing treatments and interventions, ordering and review of laboratory studies, ordering and review of radiographic studies, pulse oximetry and re-evaluation of patient's condition.   Procedures   MEDICATIONS ORDERED IN ED: Medications  insulin regular, human (MYXREDLIN) 100 units/ 100 mL infusion (has no administration in time range)  lactated ringers  infusion (has no administration in time range)  dextrose 5 % in lactated ringers infusion (has no administration in time range)  dextrose 50 % solution 0-50 mL (has no administration in time range)  potassium chloride 10 mEq in 100 mL IVPB (has no administration in time range)  0.9 %  sodium chloride infusion ( Intravenous New Bag/Given 06/04/23 1157)     IMPRESSION / MDM / ASSESSMENT AND PLAN / ED COURSE  I reviewed the triage vital signs and the nursing notes. Patient's presentation is most consistent with acute presentation with potential threat to life or bodily function.  Patient presents with hyperglycemia, differential includes hyperglycemia, mild dehydration, DKA  BMP demonstrates elevated anion gap, bicarb of 15 consistent with DKA  Patient is mildly tachycardic, IV fluids infusing  pH is overall reassuring however patient does have ketones elevated  Will start the patient on insulin drip, consult the hospitalist for admission      FINAL CLINICAL IMPRESSION(S) / ED DIAGNOSES   Final diagnoses:  Diabetic ketoacidosis without coma associated with type 1 diabetes mellitus (HCC)     Rx / DC Orders   ED Discharge Orders     None        Note:  This document was prepared using Dragon voice recognition software and may include unintentional dictation errors.   Jene Every, MD 06/04/23 1346

## 2023-06-04 NOTE — Plan of Care (Signed)
Problem: Education: Goal: Ability to describe self-care measures that may prevent or decrease complications (Diabetes Survival Skills Education) will improve Outcome: Progressing Goal: Individualized Educational Video(s) Outcome: Progressing   Problem: Cardiac: Goal: Ability to maintain an adequate cardiac output will improve Outcome: Progressing   Problem: Health Behavior/Discharge Planning: Goal: Ability to identify and utilize available resources and services will improve Outcome: Progressing Goal: Ability to manage health-related needs will improve Outcome: Progressing   Problem: Fluid Volume: Goal: Ability to achieve a balanced intake and output will improve Outcome: Progressing   Problem: Metabolic: Goal: Ability to maintain appropriate glucose levels will improve Outcome: Progressing   Problem: Nutritional: Goal: Maintenance of adequate nutrition will improve Outcome: Progressing Goal: Maintenance of adequate weight for body size and type will improve Outcome: Progressing   Problem: Respiratory: Goal: Will regain and/or maintain adequate ventilation Outcome: Progressing   Problem: Urinary Elimination: Goal: Ability to achieve and maintain adequate renal perfusion and functioning will improve Outcome: Progressing   Problem: Education: Goal: Knowledge of General Education information will improve Description: Including pain rating scale, medication(s)/side effects and non-pharmacologic comfort measures Outcome: Progressing   Problem: Health Behavior/Discharge Planning: Goal: Ability to manage health-related needs will improve Outcome: Progressing   Problem: Clinical Measurements: Goal: Ability to maintain clinical measurements within normal limits will improve Outcome: Progressing Goal: Will remain free from infection Outcome: Progressing Goal: Diagnostic test results will improve Outcome: Progressing Goal: Respiratory complications will improve Outcome:  Progressing Goal: Cardiovascular complication will be avoided Outcome: Progressing   Problem: Activity: Goal: Risk for activity intolerance will decrease Outcome: Progressing   Problem: Nutrition: Goal: Adequate nutrition will be maintained Outcome: Progressing   Problem: Coping: Goal: Level of anxiety will decrease Outcome: Progressing   Problem: Elimination: Goal: Will not experience complications related to bowel motility Outcome: Progressing Goal: Will not experience complications related to urinary retention Outcome: Progressing   Problem: Pain Managment: Goal: General experience of comfort will improve Outcome: Progressing   Problem: Safety: Goal: Ability to remain free from injury will improve Outcome: Progressing   Problem: Skin Integrity: Goal: Risk for impaired skin integrity will decrease Outcome: Progressing   Problem: Education: Goal: Ability to describe self-care measures that may prevent or decrease complications (Diabetes Survival Skills Education) will improve Outcome: Progressing Goal: Individualized Educational Video(s) Outcome: Progressing   Problem: Coping: Goal: Ability to adjust to condition or change in health will improve Outcome: Progressing   Problem: Fluid Volume: Goal: Ability to maintain a balanced intake and output will improve Outcome: Progressing   Problem: Health Behavior/Discharge Planning: Goal: Ability to identify and utilize available resources and services will improve Outcome: Progressing Goal: Ability to manage health-related needs will improve Outcome: Progressing   Problem: Metabolic: Goal: Ability to maintain appropriate glucose levels will improve Outcome: Progressing   Problem: Nutritional: Goal: Maintenance of adequate nutrition will improve Outcome: Progressing Goal: Progress toward achieving an optimal weight will improve Outcome: Progressing   Problem: Skin Integrity: Goal: Risk for impaired skin  integrity will decrease Outcome: Progressing   Problem: Tissue Perfusion: Goal: Adequacy of tissue perfusion will improve Outcome: Progressing

## 2023-06-04 NOTE — Progress Notes (Signed)
CROSS COVER NOTE  NAME: Krista Gill MRN: [id number] DOB : 2001/01/21    Concern as stated by nurse / staff   "  Pt admitted today for DKA. K 3.2 @ 2045. Insulin drip is due to be turned off about 2200. Long acting insulin administered @ 2000. "  " K is 3.2"   Pertinent findings on chart review: Admitted with DKA   Assessment and  Interventions   Assessment: DKA resolved    Latest Ref Rng & Units 06/04/2023    8:45 PM 06/04/2023    5:01 PM 06/04/2023   11:19 AM  BMP  Glucose 70 - 99 mg/dL 409  811  914   BUN 6 - 20 mg/dL 8  9  12    Creatinine 0.44 - 1.00 mg/dL 7.82  9.56  2.13   Sodium 135 - 145 mmol/L 134  135  130   Potassium 3.5 - 5.1 mmol/L 3.2  3.5  4.0   Chloride 98 - 111 mmol/L 101  104  98   CO2 22 - 32 mmol/L 22  20  15    Calcium 8.9 - 10.3 mg/dL 8.5  8.4  9.1     Plan: Transition off IV insulin and SSI per RD recommendations ordered Check Mag - 1.5 - 4 gms ordered recheck in am 40 Meq Kdur ordered po - recheck in am Transfer to med surg once IV insulin off      Donnie Mesa NP [facility] Cross [employer] 7pm-7am - check amion for availability Pager [phone]

## 2023-06-04 NOTE — ED Triage Notes (Signed)
Patient went to [facility] to get a refill on her Evaristo Bury but was sent here for hyplerglycemia and ketones in urine (CBG 364 here); Patient does not have any complaints and overall feels well; She has not missed any doses of her Evaristo Bury and ran out this morning

## 2023-06-04 NOTE — Progress Notes (Signed)
Patient accepted from ED into ICU 16.  Patient alert and oriented x4.  VSS.  Oriented patient to room and call bell placed within reach.

## 2023-06-04 NOTE — Inpatient Diabetes Management (Signed)
Inpatient Diabetes Program Recommendations  AACE/ADA: New Consensus Statement on Inpatient Glycemic Control   Target Ranges:  Prepandial:   less than 140 mg/dL      Peak postprandial:   less than 180 mg/dL (1-2 hours)      Critically ill patients:  140 - 180 mg/dL    Latest Reference Range & Units 06/04/23 11:14 06/04/23 13:42  Glucose-Capillary 70 - 99 mg/dL 376 (H) 283 (H)    Latest Reference Range & Units 06/04/23 11:19  CO2 22 - 32 mmol/L 15 (L)  Glucose 70 - 99 mg/dL 151 (H)  Anion gap 5 - 15  17 (H)    Latest Reference Range & Units 06/04/23 11:44  Beta-Hydroxybutyric Acid 0.05 - 0.27 mmol/L 5.88 (H)   Review of Glycemic Control  Diabetes history: DM60 (dx at 22 years old); requires basal, correction, and carbohydrate coverage insulin) Outpatient Diabetes medications: Tresiba 60 units daily, Novolog 11 units TID with meals plus 0-5 units for correction Current orders for Inpatient glycemic control: IV insulin  Inpatient Diabetes Program Recommendations:    Insulin: Patient just ordered IV insulin at 13:38 today. IV insulin will need to be continued until acidosis has completely resolved.   NOTE: Per ED triage note, patient went to [facility] to get refill on her Evaristo Bury but she was sent to the ED for hyperglycemia and ketones in urine. Per note by Dr. Cyril Loosen, patient reported she ran out of Tresiba insulin yesterday. Patient was last inpatient at [facility] for DKA 10/15/22-10/16/22 and was seen by inpatient diabetes coordinator on 10/15/22. In reviewing chart, noted that patient was inpatient at [facility] 04/04/23-04/06/23 for DKA as well. Per hospital summary patient was discharged on Tresiba 60 units daily, Novolog 11 units TID with meals, plus Novolog 0-5 units for correction.   Thanks, Orlando Penner, RN, MSN, CDCES Diabetes Coordinator Inpatient Diabetes Program [phone] (Team Pager from 8am to 5pm)

## 2023-06-05 DIAGNOSIS — F1290 Cannabis use, unspecified, uncomplicated: Secondary | ICD-10-CM | POA: Diagnosis not present

## 2023-06-05 DIAGNOSIS — E1010 Type 1 diabetes mellitus with ketoacidosis without coma: Secondary | ICD-10-CM

## 2023-06-05 DIAGNOSIS — E860 Dehydration: Secondary | ICD-10-CM | POA: Diagnosis not present

## 2023-06-05 LAB — URINE DRUG SCREEN, QUALITATIVE (ARMC ONLY)
Amphetamines, Ur Screen: NOT DETECTED
Barbiturates, Ur Screen: NOT DETECTED
Benzodiazepine, Ur Scrn: NOT DETECTED
Cannabinoid 50 Ng, Ur ~~LOC~~: POSITIVE — AB
Cocaine Metabolite,Ur ~~LOC~~: NOT DETECTED
MDMA (Ecstasy)Ur Screen: NOT DETECTED
Methadone Scn, Ur: NOT DETECTED
Opiate, Ur Screen: NOT DETECTED
Phencyclidine (PCP) Ur S: NOT DETECTED
Tricyclic, Ur Screen: NOT DETECTED

## 2023-06-05 LAB — GLUCOSE, CAPILLARY: Glucose-Capillary: 106 mg/dL — ABNORMAL HIGH (ref 70–99)

## 2023-06-05 LAB — CBC
HCT: 31.3 % — ABNORMAL LOW (ref 36.0–46.0)
Hemoglobin: 9.2 g/dL — ABNORMAL LOW (ref 12.0–15.0)
MCH: 19 pg — ABNORMAL LOW (ref 26.0–34.0)
MCHC: 29.4 g/dL — ABNORMAL LOW (ref 30.0–36.0)
MCV: 64.8 fL — ABNORMAL LOW (ref 80.0–100.0)
Platelets: 367 10*3/uL (ref 150–400)
RBC: 4.83 MIL/uL (ref 3.87–5.11)
RDW: 18.4 % — ABNORMAL HIGH (ref 11.5–15.5)
WBC: 4.7 10*3/uL (ref 4.0–10.5)
nRBC: 0 % (ref 0.0–0.2)

## 2023-06-05 LAB — BASIC METABOLIC PANEL
Anion gap: 8 (ref 5–15)
BUN: 7 mg/dL (ref 6–20)
CO2: 21 mmol/L — ABNORMAL LOW (ref 22–32)
Calcium: 8.1 mg/dL — ABNORMAL LOW (ref 8.9–10.3)
Chloride: 105 mmol/L (ref 98–111)
Creatinine, Ser: 0.43 mg/dL — ABNORMAL LOW (ref 0.44–1.00)
GFR, Estimated: >60 mL/min (ref >60)
Glucose, Bld: 77 mg/dL (ref 70–99)
Potassium: 3.7 mmol/L (ref 3.5–5.1)
Sodium: 134 mmol/L — ABNORMAL LOW (ref 135–145)

## 2023-06-05 MED ORDER — INSULIN ASPART 100 UNIT/ML FLEXPEN: 11.0000 [IU] | PEN_INJECTOR | Freq: Three times a day (TID) | SUBCUTANEOUS | 3 refills | Status: AC

## 2023-06-05 MED ORDER — INSULIN DEGLUDEC 100 UNIT/ML ~~LOC~~ SOPN: 60.0000 [IU] | PEN_INJECTOR | Freq: Every day | SUBCUTANEOUS | 2 refills | Status: AC

## 2023-06-05 NOTE — Plan of Care (Signed)
  Problem: Education: Goal: Ability to describe self-care measures that may prevent or decrease complications (Diabetes Survival Skills Education) will improve Outcome: Progressing Goal: Individualized Educational Video(s) Outcome: Progressing   Problem: Cardiac: Goal: Ability to maintain an adequate cardiac output will improve Outcome: Progressing   Problem: Health Behavior/Discharge Planning: Goal: Ability to identify and utilize available resources and services will improve Outcome: Progressing Goal: Ability to manage health-related needs will improve Outcome: Progressing   Problem: Fluid Volume: Goal: Ability to achieve a balanced intake and output will improve Outcome: Progressing   Problem: Metabolic: Goal: Ability to maintain appropriate glucose levels will improve Outcome: Progressing   Problem: Nutritional: Goal: Maintenance of adequate nutrition will improve Outcome: Progressing Goal: Maintenance of adequate weight for body size and type will improve Outcome: Progressing   Problem: Respiratory: Goal: Will regain and/or maintain adequate ventilation Outcome: Progressing   Problem: Urinary Elimination: Goal: Ability to achieve and maintain adequate renal perfusion and functioning will improve Outcome: Progressing   Problem: Education: Goal: Knowledge of General Education information will improve Description: Including pain rating scale, medication(s)/side effects and non-pharmacologic comfort measures Outcome: Progressing   Problem: Health Behavior/Discharge Planning: Goal: Ability to manage health-related needs will improve Outcome: Progressing   Problem: Clinical Measurements: Goal: Ability to maintain clinical measurements within normal limits will improve Outcome: Progressing Goal: Will remain free from infection Outcome: Progressing Goal: Diagnostic test results will improve Outcome: Progressing Goal: Respiratory complications will improve Outcome:  Progressing Goal: Cardiovascular complication will be avoided Outcome: Progressing   Problem: Activity: Goal: Risk for activity intolerance will decrease Outcome: Progressing   Problem: Nutrition: Goal: Adequate nutrition will be maintained Outcome: Progressing   Problem: Coping: Goal: Level of anxiety will decrease Outcome: Progressing   Problem: Elimination: Goal: Will not experience complications related to bowel motility Outcome: Progressing Goal: Will not experience complications related to urinary retention Outcome: Progressing   Problem: Pain Managment: Goal: General experience of comfort will improve Outcome: Progressing   Problem: Safety: Goal: Ability to remain free from injury will improve Outcome: Progressing   Problem: Skin Integrity: Goal: Risk for impaired skin integrity will decrease Outcome: Progressing   Problem: Education: Goal: Ability to describe self-care measures that may prevent or decrease complications (Diabetes Survival Skills Education) will improve Outcome: Progressing Goal: Individualized Educational Video(s) Outcome: Progressing   Problem: Coping: Goal: Ability to adjust to condition or change in health will improve Outcome: Progressing   Problem: Fluid Volume: Goal: Ability to maintain a balanced intake and output will improve Outcome: Progressing   Problem: Health Behavior/Discharge Planning: Goal: Ability to identify and utilize available resources and services will improve Outcome: Progressing Goal: Ability to manage health-related needs will improve Outcome: Progressing   Problem: Metabolic: Goal: Ability to maintain appropriate glucose levels will improve Outcome: Progressing   Problem: Nutritional: Goal: Maintenance of adequate nutrition will improve Outcome: Progressing Goal: Progress toward achieving an optimal weight will improve Outcome: Progressing   Problem: Skin Integrity: Goal: Risk for impaired skin  integrity will decrease Outcome: Progressing   Problem: Tissue Perfusion: Goal: Adequacy of tissue perfusion will improve Outcome: Progressing   

## 2023-06-05 NOTE — Discharge Instructions (Signed)
Go to previously scheduled appointment for endocrinology on Nov. 25th at [facility]

## 2023-06-05 NOTE — Progress Notes (Signed)
Pt transferred to 1C - RM 109 via wheelchair. All belongings taken, including cellphone, charger, bag, and clothing. Report called to Clayburn Pert, RN.

## 2023-06-07 NOTE — Discharge Summary (Signed)
Physician Discharge Summary   Patient: Krista Gill MRN: [id number] DOB: 2001/08/24  Admit date:     06/04/2023  Discharge date: 06/05/2023  Discharge Physician: Delfino Lovett   PCP: Patient, No Pcp Per   Recommendations at discharge:    Follow-up with outpatient providers as requested  Discharge Diagnoses: Principal Problem:   DKA (diabetic ketoacidosis) (HCC) Active Problems:   Diabetic ketoacidosis without coma associated with type 1 diabetes mellitus (HCC)   Dehydration   Marijuana use  Hospital Course: 22 y.o. female with medical history significant of type 1 diabetes, sickle cell trait, marijuana use presenting with DKA  Assessment and Plan: Diabetic ketoacidosis without coma associated with type 1 diabetes mellitus (HCC) Blood sugar 300-400s with overlapping ketoacidosis in the setting of missed long-acting insulin dosing Improved with DKA protocol and transition to her home dose insulin.  Patient was instructed to follow-up with her outpatient doctors and make sure she does not miss any appointment she was also requested to make sure she follows up with her endocrinologist  Dehydration Improved with hydration  Marijuana use Regular heavy marijuana use per patient Discussed cessation          Disposition: Home Diet recommendation:  Discharge Diet Orders (From admission, onward)     Start     Ordered   06/05/23 0000  Diet - low sodium heart healthy        06/05/23 0852           Carb modified diet DISCHARGE MEDICATION: Allergies as of 06/05/2023       Reactions   Pineapple Other (See Comments)   Itchy tongue/throat        Medication List     STOP taking these medications    HYDROcodone-acetaminophen 5-325 MG tablet Commonly known as: NORCO/VICODIN       TAKE these medications    insulin aspart 100 UNIT/ML FlexPen Commonly known as: NOVOLOG Inject 11 Units into the skin 3 (three) times daily with meals. Sliding Scale +1 for every  15 over What changed:  how much to take when to take this additional instructions   insulin degludec 100 UNIT/ML FlexTouch Pen Commonly known as: TRESIBA Inject 60 Units into the skin daily.   norethindrone 0.35 MG tablet Commonly known as: Ortho Micronor Take 1 tablet (0.35 mg total) by mouth daily.        Follow-up Information     [facility]. Go on 07/24/2023.   Why: [facility] Discharge Follow Up/Establishment of CARE  Nov, 14 at 1:40pm with Dr. Weston Anna               Discharge Exam: [facility] Weights   06/04/23 1118 06/04/23 1435  Weight: 73 kg 66.1 kg   Constitutional:      Appearance: She is normal weight.  HENT:     Head: Normocephalic and atraumatic.     Nose: Nose normal.     Mouth/Throat:     Mouth: Mucous membranes are moist.  Eyes:     Extraocular Movements: Extraocular movements intact.     Pupils: Pupils are equal, round, and reactive to light.  Cardiovascular:     Rate and Rhythm: Normal rate and regular rhythm.  Pulmonary:     Effort: Pulmonary effort is normal.  Abdominal:     General: Bowel sounds are normal.  Musculoskeletal:        General: Normal range of motion.     Cervical back: Normal range of motion.  Neurological:  General: No focal deficit present.  Psychiatric:        Mood and Affect: Mood normal.     Condition at discharge: good  The results of significant diagnostics from this hospitalization (including imaging, microbiology, ancillary and laboratory) are listed below for reference.   Imaging Studies: No results found.  Microbiology: Results for orders placed or performed during the hospital encounter of 06/04/23  MRSA Next Gen by PCR, Nasal     Status: None   Collection Time: 06/04/23  2:44 PM   Specimen: Nasal Mucosa; Nasal Swab  Result Value Ref Range Status   MRSA by PCR Next Gen NOT DETECTED NOT DETECTED Final    Comment: (NOTE) The GeneXpert MRSA Assay (FDA approved for NASAL specimens  only), is one component of a comprehensive MRSA colonization surveillance program. It is not intended to diagnose MRSA infection nor to guide or monitor treatment for MRSA infections. Test performance is not FDA approved in patients less than 79 years old. Performed at [facility], [address] [place], [place] [address]     Labs: CBC: Recent Labs  Lab 06/04/23 1119 06/05/23 0513  WBC 6.7 4.7  HGB 10.5* 9.2*  HCT 35.3* 31.3*  MCV 63.7* 64.8*  PLT 442* 367   Basic Metabolic Panel: Recent Labs  Lab 06/04/23 1119 06/04/23 1701 06/04/23 2045 06/05/23 0513  NA 130* 135 134* 134*  K 4.0 3.5 3.2* 3.7  CL 98 104 101 105  CO2 15* 20* 22 21*  GLUCOSE 383* 123* 114* 77  BUN 12 9 8 7   CREATININE 0.73 0.44 0.42* 0.43*  CALCIUM 9.1 8.4* 8.5* 8.1*  MG  --   --  1.5*  --   PHOS  --   --  2.8  --    Liver Function Tests: No results for input(s): "AST", "ALT", "ALKPHOS", "BILITOT", "PROT", "ALBUMIN" in the last 168 hours. CBG: Recent Labs  Lab 06/04/23 1846 06/04/23 1955 06/04/23 2057 06/04/23 2156 06/05/23 0749  GLUCAP 104* 111* 115* 111* 106*    Discharge time spent: greater than 30 minutes.  Signed: Delfino Lovett, MD [facility] 06/07/2023

## 2023-07-23 NOTE — Progress Notes (Signed)
  New patient visit Patient was not seen for appt d/t no call, no show, or late arrival >10 mins past appt time.    Debera Lat PA [facility] [address] [place], [place] [address] [phone] (phone) [phone] (fax) [facility]

## 2023-07-24 ENCOUNTER — Ambulatory Visit (INDEPENDENT_AMBULATORY_CARE_PROVIDER_SITE_OTHER): Payer: Medicaid Other | Admitting: Physician Assistant

## 2023-07-24 DIAGNOSIS — Z7689 Persons encountering health services in other specified circumstances: Secondary | ICD-10-CM

## 2023-07-24 DIAGNOSIS — Z91199 Patient's noncompliance with other medical treatment and regimen due to unspecified reason: Secondary | ICD-10-CM

## 2023-10-11 ENCOUNTER — Emergency Department
Admission: EM | Admit: 2023-10-11 | Discharge: 2023-10-11 | Disposition: A | Discharge status: Home / Self Care | Payer: Medicaid Other | Attending: Emergency Medicine | Admitting: Emergency Medicine

## 2023-10-11 ENCOUNTER — Other Ambulatory Visit: Payer: Self-pay

## 2023-10-11 DIAGNOSIS — K529 Noninfective gastroenteritis and colitis, unspecified: Secondary | ICD-10-CM | POA: Insufficient documentation

## 2023-10-11 DIAGNOSIS — E1065 Type 1 diabetes mellitus with hyperglycemia: Secondary | ICD-10-CM | POA: Diagnosis not present

## 2023-10-11 DIAGNOSIS — E876 Hypokalemia: Secondary | ICD-10-CM | POA: Diagnosis not present

## 2023-10-11 DIAGNOSIS — E109 Type 1 diabetes mellitus without complications: Secondary | ICD-10-CM

## 2023-10-11 DIAGNOSIS — R1110 Vomiting, unspecified: Secondary | ICD-10-CM | POA: Diagnosis present

## 2023-10-11 LAB — URINALYSIS, ROUTINE W REFLEX MICROSCOPIC
Bilirubin Urine: NEGATIVE
Glucose, UA: >=500 mg/dL — AB
Ketones, ur: 80 mg/dL — AB
Nitrite: NEGATIVE
Protein, ur: 30 mg/dL — AB
RBC / HPF: >50 RBC/hpf (ref 0–5)
Specific Gravity, Urine: 1.017 (ref 1.005–1.030)
pH: 6 (ref 5.0–8.0)

## 2023-10-11 LAB — POC URINE PREG, ED: Preg Test, Ur: NEGATIVE

## 2023-10-11 LAB — COMPREHENSIVE METABOLIC PANEL
ALT: 16 U/L (ref 0–44)
AST: 24 U/L (ref 15–41)
Albumin: 4.3 g/dL (ref 3.5–5.0)
Alkaline Phosphatase: 49 U/L (ref 38–126)
Anion gap: 12 (ref 5–15)
BUN: 11 mg/dL (ref 6–20)
CO2: 20 mmol/L — ABNORMAL LOW (ref 22–32)
Calcium: 9.6 mg/dL (ref 8.9–10.3)
Chloride: 104 mmol/L (ref 98–111)
Creatinine, Ser: 0.44 mg/dL (ref 0.44–1.00)
GFR, Estimated: >60 mL/min (ref >60)
Glucose, Bld: 191 mg/dL — ABNORMAL HIGH (ref 70–99)
Potassium: 3.3 mmol/L — ABNORMAL LOW (ref 3.5–5.1)
Sodium: 136 mmol/L (ref 135–145)
Total Bilirubin: 1 mg/dL (ref 0.0–1.2)
Total Protein: 8.4 g/dL — ABNORMAL HIGH (ref 6.5–8.1)

## 2023-10-11 LAB — LIPASE, BLOOD: Lipase: 27 U/L (ref 11–51)

## 2023-10-11 LAB — BETA-HYDROXYBUTYRIC ACID: Beta-Hydroxybutyric Acid: 0.58 mmol/L — ABNORMAL HIGH (ref 0.05–0.27)

## 2023-10-11 LAB — CBC
HCT: 32.6 % — ABNORMAL LOW (ref 36.0–46.0)
Hemoglobin: 9.7 g/dL — ABNORMAL LOW (ref 12.0–15.0)
MCH: 19 pg — ABNORMAL LOW (ref 26.0–34.0)
MCHC: 29.8 g/dL — ABNORMAL LOW (ref 30.0–36.0)
MCV: 63.9 fL — ABNORMAL LOW (ref 80.0–100.0)
Platelets: 437 10*3/uL — ABNORMAL HIGH (ref 150–400)
RBC: 5.1 MIL/uL (ref 3.87–5.11)
RDW: 19.2 % — ABNORMAL HIGH (ref 11.5–15.5)
WBC: 9.5 10*3/uL (ref 4.0–10.5)
nRBC: 0.3 % — ABNORMAL HIGH (ref 0.0–0.2)

## 2023-10-11 LAB — CBG MONITORING, ED: Glucose-Capillary: 200 mg/dL — ABNORMAL HIGH (ref 70–99)

## 2023-10-11 MED ORDER — ONDANSETRON HCL 4 MG/2ML IJ SOLN
4.0000 mg | Freq: Once | INTRAMUSCULAR | Status: AC
  Administered 2023-10-11: 4 mg via INTRAVENOUS
  Filled 2023-10-11: qty 2

## 2023-10-11 MED ORDER — POTASSIUM CHLORIDE CRYS ER 20 MEQ PO TBCR: 20.0000 meq | EXTENDED_RELEASE_TABLET | Freq: Two times a day (BID) | ORAL | 0 refills | Status: DC

## 2023-10-11 MED ORDER — LACTATED RINGERS IV BOLUS
1000.0000 mL | Freq: Once | INTRAVENOUS | Status: AC
  Administered 2023-10-11: 1000 mL via INTRAVENOUS

## 2023-10-11 MED ORDER — KETOROLAC TROMETHAMINE 15 MG/ML IJ SOLN
15.0000 mg | Freq: Once | INTRAMUSCULAR | Status: AC
  Administered 2023-10-11: 15 mg via INTRAVENOUS
  Filled 2023-10-11: qty 1

## 2023-10-11 MED ORDER — ONDANSETRON 4 MG PO TBDP: 4.0000 mg | ORAL_TABLET | Freq: Three times a day (TID) | ORAL | 0 refills | Status: AC | PRN

## 2023-10-11 MED ORDER — ONDANSETRON HCL 4 MG/2ML IJ SOLN
INTRAMUSCULAR | Status: AC
  Administered 2023-10-11: 4 mg via INTRAVENOUS
  Filled 2023-10-11: qty 2

## 2023-10-11 MED ORDER — ONDANSETRON HCL 4 MG/2ML IJ SOLN: 4.0000 mg | Freq: Once | INTRAMUSCULAR | Status: AC

## 2023-10-11 NOTE — ED Provider Triage Note (Signed)
Emergency Medicine Provider Triage Evaluation Note  Krista Gill , a 23 y.o. female  was evaluated in triage.  Pt complains of n/v/d that began today. Currently on her menstrual cycle. No blood in emesis or stool. +abd pain, described as sharp. H/o type 1 diabetes and DKA. No known sick contacts.  Review of Systems  Positive: N/v/d Negative: fever  Physical Exam  There were no vitals taken for this visit. Gen:   Awake, no distress   Resp:  Normal effort  MSK:   Moves extremities without difficulty Other:    Medical Decision Making  Medically screening exam initiated at 12:42 PM.  Appropriate orders placed.  ELENI FRANK was informed that the remainder of the evaluation will be completed by another provider, this initial triage assessment does not replace that evaluation, and the importance of remaining in the ED until their evaluation is complete.     Jackelyn Hoehn, PA-C 10/11/23 1247

## 2023-10-11 NOTE — ED Triage Notes (Signed)
First nurse note: Pt to ED via [facility] from home. Pt reports N/V/D and abd pain that started this AM. Vomited x2 PTA, no vomiting with EMS .   HR 76 100% RA  BP 156/72 CBG 195

## 2023-10-11 NOTE — ED Provider Notes (Addendum)
[facility] Provider Note    Event Date/Time   First MD Initiated Contact with Patient 10/11/23 1526     (approximate)   History   Emesis, Diarrhea, and Abdominal Pain   HPI  Krista Gill is a 23 y.o. female with type 1 diabetes who comes in with concerns for emesis diarrhea and abdominal pain.  Patient reports that she ate something from a restaurant last night.  She states that she thinks she got sick from it.  She reports that this morning she developed nausea vomiting diarrhea.  She reports some generalized abdominal cramping but no focal pain.  She reports being compliant with her diabetes medicine.  States that she feels that she does need something for the pain and some fluids and that she will feel better.  I reviewed a hospital admission from 9/25 where patient was admitted and had anion gap elevation consistent with DKA  Physical Exam   Triage Vital Signs: ED Triage Vitals  Encounter Vitals Group     BP 10/11/23 1245 125/81     Systolic BP Percentile --      Diastolic BP Percentile --      Pulse Rate 10/11/23 1245 78     Resp 10/11/23 1245 20     Temp 10/11/23 1245 97.8 F (36.6 C)     Temp Source 10/11/23 1245 Oral     SpO2 10/11/23 1245 100 %     Weight 10/11/23 1247 159 lb (72.1 kg)     Height 10/11/23 1247 4\' 11"  (1.499 m)     Head Circumference --      Peak Flow --      Pain Score 10/11/23 1242 9     Pain Loc --      Pain Education --      Exclude from Growth Chart --     Most recent vital signs: Vitals:   10/11/23 1245  BP: 125/81  Pulse: 78  Resp: 20  Temp: 97.8 F (36.6 C)  SpO2: 100%     General: Awake, no distress.  CV:  Good peripheral perfusion.  Resp:  Normal effort.  Abd:  No distention.  Soft and nontender Other:     ED Results / Procedures / Treatments   Labs (all labs ordered are listed, but only abnormal results are displayed) Labs Reviewed  COMPREHENSIVE METABOLIC PANEL - Abnormal; Notable  for the following components:      Result Value   Potassium 3.3 (*)    CO2 20 (*)    Glucose, Bld 191 (*)    Total Protein 8.4 (*)    All other components within normal limits  CBC - Abnormal; Notable for the following components:   Hemoglobin 9.7 (*)    HCT 32.6 (*)    MCV 63.9 (*)    MCH 19.0 (*)    MCHC 29.8 (*)    RDW 19.2 (*)    Platelets 437 (*)    nRBC 0.3 (*)    All other components within normal limits  BETA-HYDROXYBUTYRIC ACID - Abnormal; Notable for the following components:   Beta-Hydroxybutyric Acid 0.58 (*)    All other components within normal limits  LIPASE, BLOOD  URINALYSIS, ROUTINE W REFLEX MICROSCOPIC  POC URINE PREG, ED      RADIOLOGY None   PROCEDURES:  Critical Care performed: No  Procedures   MEDICATIONS ORDERED IN ED: Medications - No data to display   IMPRESSION / MDM / ASSESSMENT AND PLAN /  ED COURSE  I reviewed the triage vital signs and the nursing notes.   Patient's presentation is most consistent with acute presentation with potential threat to life or bodily function.   Patient comes in with nausea vomiting diarrhea I suspect this is most likely viral versus food poisoning.  Labs are ordered evaluate for DKA.  She has got slight elevation of her beta hydroxybutyrate and her bicarb is 20 but I suspect that this is more than likely just from some mild dehydration.  Her anion gap is normal at 12 she is not acidotic.  Her sugar is 191.  At this time I think we can hold off on IV insulin and treat with fluids, Zofran, Toradol.  Her abdomen is soft and nontender I have low suspicion for acute abdominal process.  I suspect this is most likely gastroenteritis.  Patient is also agreeable to holding off on CT imaging.  Her CMP shows slightly low potassium at 3.3.  CBC shows hemoglobin of 9.7 which is around her baseline.  She is currently menstruating.  Lipase is normal   4:23 PM repeat abdominal exam is soft and nontender  5:04 PM Re-peat  abdominal exam remains soft she feels comfortbale with dc home after glucose check.  Suspect gastroenteritis.  We did discuss her slightly low bicarb and her slightly elevated beta-hydroxybutyrate and how that she has the potential of going back into DKA given her history of this previously.  Patient is very aware of the symptoms and she stated that she would return if she started to feel like that again for repeat evaluation but at this time she feels comfortable and would prefer discharge home given feeling much better after medications.  Pt otlerating PO   6:28 PM repeat sugar is 200.  Patient reports feeling significantly better we discussed that she has a high chance of going into DKA with her history of recurrent DKA's in the past but at this time no evidence of DKA.  We discussed that it is sometimes difficult to tell if she is in DKA versus just a gastroenteritis if she starts feeling really nauseous and vomiting again that she can always come back for recheck of blood work.  She expressed understanding and felt comfortable with discharge home and refer discharge home at this time  Considered admission but given resolution of symptoms with reassuring blood work patient can be discharged home and trial outpatient treatment of gastroenteritis   FINAL CLINICAL IMPRESSION(S) / ED DIAGNOSES   Final diagnoses:  Gastroenteritis  Type 1 diabetes mellitus without complication (HCC)     Rx / DC Orders   ED Discharge Orders     None        Note:  This document was prepared using Dragon voice recognition software and may include unintentional dictation errors.   Concha Se, MD 10/11/23 1705    Concha Se, MD 10/11/23 [phone]

## 2023-10-11 NOTE — Discharge Instructions (Signed)
We discussed how this is most likely a viral gastroenteritis but given you have diabetes you are at high risk of going into DKA so please keep a close eye on your sugars and if symptoms are getting worse please return for repeat blood work.  At this time we are going to prescribe you some Zofran to help with nausea and you should continue your insulin and return for worsening symptoms or any other concerns

## 2023-10-11 NOTE — ED Notes (Signed)
Pt has po fluids, has had some to drink no issues as far as n/v thus far

## 2023-10-11 NOTE — ED Notes (Signed)
Pt states that she feels better, BG checked, Dr Fuller Plan aware

## 2023-10-11 NOTE — ED Triage Notes (Signed)
Pt to ED for NVD since this AM. Has had 2 or 3 episodes of vomiting and diarrhea today. Is currently on period. Also 9/10 sharp lower bilateral abdominal pain. Denies urinary symptoms. Denies hematuria, fevers, blood in stool.   Hx DM type 1 and DKA. Has been taking insulin as prescribed. CBGs today were 273 and 190. Skin is dry.

## 2023-11-17 ENCOUNTER — Other Ambulatory Visit: Payer: Self-pay | Admitting: Family Medicine

## 2023-11-17 DIAGNOSIS — Z3009 Encounter for other general counseling and advice on contraception: Secondary | ICD-10-CM

## 2023-12-02 ENCOUNTER — Encounter: Payer: Self-pay | Admitting: Nurse Practitioner

## 2023-12-02 ENCOUNTER — Ambulatory Visit: Admitting: Nurse Practitioner

## 2023-12-02 VITALS — BP 111/76 | HR 89 | Wt 164.0 lb

## 2023-12-02 DIAGNOSIS — Z3202 Encounter for pregnancy test, result negative: Secondary | ICD-10-CM | POA: Diagnosis not present

## 2023-12-02 DIAGNOSIS — Z309 Encounter for contraceptive management, unspecified: Secondary | ICD-10-CM

## 2023-12-02 DIAGNOSIS — Z0000 Encounter for general adult medical examination without abnormal findings: Secondary | ICD-10-CM

## 2023-12-02 DIAGNOSIS — Z3009 Encounter for other general counseling and advice on contraception: Secondary | ICD-10-CM

## 2023-12-02 LAB — PREGNANCY, URINE: Preg Test, Ur: NEGATIVE

## 2023-12-02 MED ORDER — ELLA 30 MG PO TABS: 1.0000 | ORAL_TABLET | Freq: Once | ORAL | 0 refills | Status: AC

## 2023-12-02 MED ORDER — NORETHINDRONE 0.35 MG PO TABS: 1.0000 | ORAL_TABLET | Freq: Every day | ORAL | 12 refills | Status: DC

## 2023-12-13 NOTE — Progress Notes (Signed)
 [employer] HE[facility] DEPARTMENT [facility] [address] [place] [address] Main phone: [phone]  Family Planning Visit - Repeat Yearly Visit  Subjective:  Krista Gill is a 23 y.o. G0P0000  being seen today for an annual wellness visit and to discuss contraception options. The patient is currently using no method -    for pregnancy prevention. Patient does not want a pregnancy in the next year.   Patient reports they are looking for a method with the following characteristics:  Does not involve insertion  Method they can control starting and stopping  Patient has the following medical problems:  Patient Active Problem List   Diagnosis Date Noted   DKA (diabetic ketoacidosis) (HCC) 06/04/2023   Marijuana use 06/04/2023   DKA, type 1 (HCC) 10/16/2022   Overweight (BMI 25.0-29.9) 12/03/2021   Hypokalemia 12/02/2021   Leukocytosis 12/02/2021   Cellulitis of left ankle 12/02/2021   Diabetic ketoacidosis without coma associated with type 1 diabetes mellitus (HCC) 06/14/2021   Poor compliance    DKA (diabetic ketoacidosis) (HCC) 02/09/2017   Adjustment reaction of adolescence    Non compliance w medication regimen    Type 1 diabetes mellitus with ketoacidosis, uncontrolled (HCC)    Dehydration    Ketonuria    Inadequate parental supervision and control     Chief Complaint  Patient presents with   Annual Exam    Pt is here for PE and BC refill    HPI Patient is a pleasant 23 y.o. female who presents to the office today for annual well woman exam and oral contraceptives.  Patient indicates her only symptom today is a 3lb weight loss in 2 months. She attributes this to a better diet. She reports being on her feet all day at work, which she considers exercise. She does report that she will be starting a gym membership soon.  Patient indicates 1 female partner in the last 12 months. She reports practicing vaginal sex and does not use  condoms. Patient indicates a history of Chlamydia in 2022. Patient reports last sex was 1 day ago without condom. She indicates last use of oral contraceptive pills for pregnancy prevention was in February when she ran out.  Patient indicates LMP was 11/12/23 (20 days ago).     Review of Systems  Constitutional:  Negative for weight loss.       Positive for weight gain.  HENT:  Negative for sore throat.   Eyes:  Negative for blurred vision.  Respiratory:  Negative for cough, shortness of breath and wheezing.   Cardiovascular:  Negative for chest pain and claudication.  Gastrointestinal:  Negative for nausea and vomiting.  Genitourinary:  Negative for dysuria and frequency.  Skin:  Negative for rash.  Neurological:  Negative for dizziness, seizures and headaches.  Endo/Heme/Allergies:  Does not bruise/bleed easily.    See flowsheet for other program required questions.   Diabetes screening This patient is 23 y.o. with a BMI of Body mass index is 33.12 kg/m.Marland Kitchen  Is patient eligible for diabetes screening (age >35 and BMI >25)?  no  Was Hgb A1c ordered? not applicable  STI screening Patient reports 1 of partners in last year.  Does this patient desire STI screening?  No -    Hepatitis C screening Has patient been screened once for HCV in the past?  No  No results found for: "HCVAB"  Does the patient meet criteria for HCV testing? No  (If yes-- Screen for  HCV through [facility]) Criteria:  Since the last HCV result, does the patient have any of the following? - Current drug use - Have a partner with drug use - Has been incarcerated  Hepatitis B screening Does the patient meet criteria for HBV testing? No Criteria:  -Household, sexual or needle sharing contact with HBV -History of drug use -HIV positive -Those with known Hep C  Cervical Cancer Screening  No Cervical Cancer Screening results to display.  Health Maintenance Due  Topic Date Due   Pneumococcal Vaccine  69-10 Years old (1 of 2 - PCV) Never done   FOOT EXAM  Never done   OPHTHALMOLOGY EXAM  Never done   HPV VACCINES (1 - 3-dose series) Never done   Diabetic kidney evaluation - Urine ACR  Never done   Hepatitis C Screening  Never done   CHLAMYDIA SCREENING  06/14/2022   DTaP/Tdap/Td (1 - Tdap) Never done   COVID-19 Vaccine (1 - 2024-25 season) Never done   HEMOGLOBIN A1C  12/02/2023    The following portions of the patient's history were reviewed and updated as appropriate: allergies, current medications, past family history, past medical history, past social history, past surgical history and problem list. Problem list updated.  Objective:   Vitals:   12/02/23 0922  BP: 111/76  Pulse: 89  Weight: 164 lb (74.4 kg)    Physical Exam Vitals and nursing note reviewed.  Constitutional:      Appearance: Normal appearance.  HENT:     Head: Normocephalic.     Salivary Glands: Right salivary gland is not diffusely enlarged or tender. Left salivary gland is not diffusely enlarged or tender.     Mouth/Throat:     Lips: Pink. No lesions.     Mouth: Mucous membranes are moist.     Tongue: No lesions. Tongue does not deviate from midline.     Pharynx: Oropharynx is clear. Uvula midline.     Tonsils: No tonsillar exudate.  Eyes:     General:        Right eye: No discharge.        Left eye: No discharge.     Conjunctiva/sclera:     Right eye: Right conjunctiva is not injected.     Left eye: Left conjunctiva is not injected.  Neck:     Thyroid: No thyroid mass, thyromegaly or thyroid tenderness.     Trachea: Trachea and phonation normal. No tracheal tenderness or tracheal deviation.  Cardiovascular:     Rate and Rhythm: Normal rate and regular rhythm.     Heart sounds: Normal heart sounds, S1 normal and S2 normal.  Pulmonary:     Effort: Pulmonary effort is normal.     Breath sounds: Normal breath sounds and air entry.  Chest:     Comments: CBE not performed today based on age  guidelines set by ACOG and no patient concerns at this time.  Abdominal:     General: Abdomen is flat. Bowel sounds are normal. There is no distension.     Palpations: Abdomen is soft.     Tenderness: There is no abdominal tenderness. There is no guarding or rebound.  Genitourinary:    Comments: PAP not due today based on age. Patient asymptomatic and does not want STI testing.  Lymphadenopathy:     Head:     Right side of head: No submental, submandibular, tonsillar, preauricular or posterior auricular adenopathy.     Left side of head: No submental, submandibular,  tonsillar, preauricular or posterior auricular adenopathy.     Cervical: No cervical adenopathy.     Right cervical: No superficial or posterior cervical adenopathy.    Left cervical: No superficial or posterior cervical adenopathy.     Upper Body:     Right upper body: No supraclavicular or axillary adenopathy.     Left upper body: No supraclavicular or axillary adenopathy.  Skin:    General: Skin is warm and dry.     Findings: No rash.  Neurological:     Mental Status: She is alert and oriented to person, place, and time.  Psychiatric:        Attention and Perception: Attention normal.        Mood and Affect: Mood and affect normal.        Speech: Speech normal.        Behavior: Behavior normal. Behavior is cooperative.        Thought Content: Thought content normal.     Assessment and Plan:  Krista Gill is a 23 y.o. female G0P0000 presenting to the [facility] Department for an yearly wellness and contraception visit  1. Family planning (Primary) Contraception counseling: Reviewed options based on patient desire and reproductive life plan. Patient is interested in Oral Contraceptive. This was provided to the patient today.  Risks, benefits, and typical effectiveness rates were reviewed.  Questions were answered.  Written information was also given to the patient to review.   The patient will  follow up in  1 years for surveillance.  The patient was told to call with any further questions, or with any concerns about this method of contraception.  Emphasized use of condoms 100% of the time for STI prevention. Educated on ECP and assessed need for ECP. Patient was offered ECP based on Unprotected sex within past 72 hours.  Patient is within 1 days of unprotected sex. Patient was offered ECP. Reviewed options and patient desired Ella (Ulipristal)   - Pregnancy, urine - ulipristal acetate (ELLA) 30 MG tablet; Take 1 tablet (30 mg total) by mouth once for 1 dose.  Dispense: 1 tablet; Refill: 0 - norethindrone (ORTHO MICRONOR) 0.35 MG tablet; Take 1 tablet (0.35 mg total) by mouth daily.  Dispense: 28 tablet; Refill: 12  2. Well woman exam (no gynecological exam) PAP not due today per age guidelines according to ASCCP.  CBE not performed. Not indicated per ACOG based on age guidelines and no symptoms. PCP list given to patient and patient encouraged to establish care.  Discussed lifestyle modifications to promote healthy weight management including diet and exercise such as increasing fruit, vegetable, and whole grain intake. Being mindful of portion size and to limit high-fat, high-cholesterol foods like those that are fried, red meats, and dairy products. Also limit simple carbohydrates and foods and beverages high in sugar.  Increase water intake.  Patient encouraged to exercise for at least 30 minutes at a moderate activity level 5 times a week or for 3 times a week with a vigorous 1-hour exercise routine. Patient encouraged to make gradual changes and not over exert them self.    Return in about 1 year (around 12/01/2024) for annual well-woman exam and PAP.  No future appointments.  Edmonia James, NP

## 2024-05-18 ENCOUNTER — Emergency Department: Admission: EM | Admit: 2024-05-18 | Discharge: 2024-05-18 | Disposition: A | Discharge status: Home / Self Care

## 2024-05-18 ENCOUNTER — Other Ambulatory Visit: Payer: Self-pay

## 2024-05-18 DIAGNOSIS — R21 Rash and other nonspecific skin eruption: Secondary | ICD-10-CM

## 2024-05-18 DIAGNOSIS — N898 Other specified noninflammatory disorders of vagina: Secondary | ICD-10-CM | POA: Diagnosis not present

## 2024-05-18 DIAGNOSIS — E119 Type 2 diabetes mellitus without complications: Secondary | ICD-10-CM | POA: Diagnosis not present

## 2024-05-18 LAB — URINALYSIS, ROUTINE W REFLEX MICROSCOPIC
Bilirubin Urine: NEGATIVE
Glucose, UA: >=500 mg/dL — AB
Hgb urine dipstick: NEGATIVE
Ketones, ur: 80 mg/dL — AB
Leukocytes,Ua: NEGATIVE
Nitrite: NEGATIVE
Protein, ur: NEGATIVE mg/dL
Specific Gravity, Urine: 1.021 (ref 1.005–1.030)
Squamous Epithelial / HPF: 0 /HPF (ref 0–5)
pH: 5 (ref 5.0–8.0)

## 2024-05-18 LAB — CHLAMYDIA/NGC RT PCR (ARMC ONLY)
Chlamydia Tr: NOT DETECTED
N gonorrhoeae: NOT DETECTED

## 2024-05-18 LAB — WET PREP, GENITAL
Sperm: NONE SEEN
Trich, Wet Prep: NONE SEEN
WBC, Wet Prep HPF POC: >=10 — AB (ref <10)
Yeast Wet Prep HPF POC: NONE SEEN

## 2024-05-18 MED ORDER — AZITHROMYCIN 500 MG PO TABS: 1000.0000 mg | ORAL_TABLET | Freq: Every day | ORAL | 0 refills | Status: AC

## 2024-05-18 MED ORDER — VALACYCLOVIR HCL 1 G PO TABS: 1000.0000 mg | ORAL_TABLET | Freq: Two times a day (BID) | ORAL | 0 refills | Status: DC

## 2024-05-18 NOTE — Discharge Instructions (Addendum)
 Your evaluated in the ED for a genital rash.  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  We will go ahead and treat for empirical treatment.  Your chlamydia/gonorrhea, HSV 2, and syphilis is pending you can check your MyChart for these results.  Follow-up with the health department for further evaluation and education or OB/GYN.

## 2024-05-18 NOTE — ED Provider Notes (Signed)
 [facility] Emergency Department Provider Note     Event Date/Time   First MD Initiated Contact with Patient 05/18/24 1816     (approximate)   History   Vaginal Itching   HPI  Krista Gill is a 23 y.o. female with a past medical history of diabetes and sickle cell trait presents to the ED for vaginal lesions noted today.  Patient reports she shaved yesterday and noticed multiple painless lesions on her vagina.  She denies vaginal itching, vaginal discharge, vaginal bleeding, dysuria.  Denies being sexually active at this time and has low concern for STDs.  Denies fever.  She reports she has never experienced this before.  She reports she shaves daily and admits to not using a new razor.     Physical Exam   Triage Vital Signs: ED Triage Vitals [05/18/24 1807]  Encounter Vitals Group     BP 134/86     Girls Systolic BP Percentile      Girls Diastolic BP Percentile      Boys Systolic BP Percentile      Boys Diastolic BP Percentile      Pulse Rate 100     Resp 20     Temp 99.2 F (37.3 C)     Temp src      SpO2 98 %     Weight      Height      Head Circumference      Peak Flow      Pain Score 6     Pain Loc      Pain Education      Exclude from Growth Chart     Most recent vital signs: Vitals:   05/18/24 1807  BP: 134/86  Pulse: 100  Resp: 20  Temp: 99.2 F (37.3 C)  SpO2: 98%    General Awake, no distress.  HEENT NCAT.  CV:  Good peripheral perfusion.  RESP:  Normal effort.  ABD:  No distention.  Other:  Multiple, scattered, nontender to palpation, nonindurated, sharply demarcated circular borders of small grouped, flat ulcer-like lesions on external genitalia, on bilateral labial majora that extends to perineal region.  Nonerythemic.  No bleeding.   ED Results / Procedures / Treatments   Labs (all labs ordered are listed, but only abnormal results are displayed) Labs Reviewed  WET PREP, GENITAL - Abnormal; Notable  for the following components:      Result Value   Clue Cells Wet Prep HPF POC PRESENT (*)    WBC, Wet Prep HPF POC >=10 (*)    All other components within normal limits  URINALYSIS, ROUTINE W REFLEX MICROSCOPIC - Abnormal; Notable for the following components:   Color, Urine STRAW (*)    APPearance CLEAR (*)    Glucose, UA >=500 (*)    Ketones, ur 80 (*)    Bacteria, UA RARE (*)    All other components within normal limits  CHLAMYDIA/NGC RT PCR ([facility] ONLY)            RPR  HSV 2 ANTIBODY, IGG   No results found.  PROCEDURES:  Critical Care performed: No  Procedures   MEDICATIONS ORDERED IN ED: Medications - No data to display   IMPRESSION / MDM / ASSESSMENT AND PLAN / ED COURSE  I reviewed the triage vital signs and the nursing notes.  23 y.o. female presents to the emergency department for evaluation and treatment of sudden painless vaginal lesions. See HPI for further details.   Differential diagnosis includes, but is not limited to folliculitis, syphilis, HSV-2, chancroid, chlamydia, gonorrhea,  Patient's presentation is most consistent with acute complicated illness / injury requiring diagnostic workup.  Patient is alert and oriented.  Pelvic exam is as stated above.  High suspicion for HSV-2 vs Chancroid, however history of lesions appearing in just 24 hours is not consistent with this diagnosis.  History of daily shaving in this area increases my suspicion for folliculitis however presentation is not consistent.  UTI reveals no infectious etiology.  Noted glucose and ketones which is consistent with patient's baseline.  Plan will be to workup patient with HSV-2, RPR, wet prep and chlamydia gonorrhea.  Patient is wanting to be discharged and will check her MyChart for results.  I have sent a prescription of Valtrex  and azithromycin  for empiric treatment of HSV-2 and chancroid.  I have advised patient close follow-up with the health department  or OB/GYN for further evaluation.  She verbalized understanding.  At discharge, noted increase in pulse rate of 127 bpm.  Discussed with patient to further workup with basic labs to evaluate increase of pulse rate, however patient declined and reports increased stress following discussion of what the lesions could be.  She admits to recently being sexually active a week ago with a new partner and yesterday was told that he has been cheating on her with other females with no protection.  She reports she has been going through a lot today with the discovery of these lesions.   This patient has elected to leave against medical advice. In my opinion, the patient has capacity to leave AMA. The patient is clinically sober, free from distracting injury, appears to have intact insight and judgment and reason, and in my opinion has capacity to make decisions. The patient is refusing any further care. I am unable to convince the patient to stay. I have asked them to return as soon as possible to complete their evaluation, and also explained that they were welcome to return to the ER for further evaluation whenever they choose. I have asked the patient to follow up with their primary doctor, health department or OB/GYN as soon as possible. I have answered all their questions.   Patient discharged home.   FINAL CLINICAL IMPRESSION(S) / ED DIAGNOSES   Final diagnoses:  Vaginal itching  Rash    Rx / DC Orders   ED Discharge Orders          Ordered    valACYclovir  (VALTREX ) 1000 MG tablet  2 times daily        05/18/24 2119             Note:  This document was prepared using Dragon voice recognition software and may include unintentional dictation errors.    Margrette, Kha Hari A, PA-C 05/18/24 2239    Nicholaus Rolland BRAVO, MD 05/18/24 [phone]

## 2024-05-18 NOTE — ED Triage Notes (Signed)
 Patient reports bumps on her vagina; denies discharge.

## 2024-05-19 ENCOUNTER — Telehealth: Payer: Self-pay | Admitting: Emergency Medicine

## 2024-05-19 LAB — RPR
RPR Ser Ql: REACTIVE — AB
RPR Titer: 1:1 {titer}

## 2024-05-19 NOTE — Telephone Encounter (Cosign Needed)
 Patient was called at phone number listed on EMR.  Received voicemail and unable to leave message on voicemail to update patient of positive RPR test.

## 2024-05-20 LAB — HSV 2 ANTIBODY, IGG: HSV 2 Glycoprotein G Ab, IgG: NONREACTIVE

## 2024-05-20 LAB — T.PALLIDUM AB, TOTAL: T Pallidum Abs: NONREACTIVE

## 2024-06-02 NOTE — Progress Notes (Unsigned)
 GYN ENCOUNTER  Encounter for f/u from ED visit  Subjective  HPI: Krista Gill is a 23 y.o. G0P0000 who presents today for f/u from ED visit. She was seen in the ED on 9/9 with complaints of approximately 1 wk of vaginal pain and vulvar blistering. She had recently shaved her pubic hair, so she thought the pain might have been related to razor burn or inflamed hair follicles. 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 Her labs showed Reactive RPR 1:1, but negative TPA, Negative HSV IgG, and bacterial vaginosis. She was treated for BV with po metronidazole  and completed the course. She reports her symptoms have improved.    Past Medical History:  Diagnosis Date   Diabetes mellitus without complication (HCC)    Diabetic ketoacidosis (HCC)    Sickle cell trait    Urinary tract infection    Past Surgical History:  Procedure Laterality Date   ADENOIDECTOMY     MYRINGOTOMY     TONSILLECTOMY     OB History     Gravida  0   Para  0   Term  0   Preterm  0   AB  0   Living  0      SAB  0   IAB  0   Ectopic  0   Multiple  0   Live Births  0          Allergies  Allergen Reactions   Pineapple Other (See Comments)    Itchy tongue/throat    Constitutional: Denied constitutional symptoms, night sweats, recent illness, fatigue, fever, insomnia and weight loss.  Eyes: Denied eye symptoms, eye pain, photophobia, vision change and visual disturbance.  Ears/Nose/Throat/Neck: Denied ear, nose, throat or neck symptoms, hearing loss, nasal discharge, sinus congestion and sore throat.  Cardiovascular: Denied cardiovascular symptoms, arrhythmia, chest pain/pressure, edema, exercise intolerance, orthopnea and palpitations.  Respiratory: Denied pulmonary symptoms, asthma, pleuritic pain, productive sputum, cough, dyspnea and wheezing.  Gastrointestinal: Denied,  gastro-esophageal reflux, melena, nausea and vomiting.  Genitourinary: Denied genitourinary symptoms including symptomatic vaginal discharge, pelvic relaxation issues, and urinary complaints.  Musculoskeletal: Denied musculoskeletal symptoms, stiffness, swelling, muscle weakness and myalgia.  Dermatologic: Denied dermatology symptoms, rash and scar.  Neurologic: Denied neurology symptoms, dizziness, headache, neck pain and syncope.  Psychiatric: Denied psychiatric symptoms, anxiety and depression.  Endocrine: Denied endocrine symptoms including hot flashes and night sweats.    Objective  BP 115/73   Pulse 98   Ht 4' 11 (1.499 m)   Wt 148 lb (67.1 kg)   BMI 29.89 kg/m   Physical examination   Pelvic:   Vulva: Scattered healing lesions/ ulcerations with clean wound beds  Vagina: Scattered healing lesions/ ulcerations with clean wound beds  Support: Normal pelvic support.  Urethra No masses tenderness or scarring.  Meatus Normal size without lesions or prolapse.  Cervix: Deferred  Anus: Normal exam.  No lesions.  Perineum: Scattered healing lesions/ ulcerations with clean wound beds    Assessment/ Plan: Vulvar lesion -Pictures of vulvar lesions taken and uploaded into Media section of patient chart. She reports that they are improved. She attributes this to the po flagyl  she took for bacterial vaginosis that was diagnosed in the ER. Repeat BV swab today to ensure resolution. 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 Pt has never had any sx like this before, so this is likely a  primary outbreak. She is not currently requiring any strategies for comfort/ pain management. HSV IgG was negative. IgM testing is not available. Will repeat IgG today. F/u pending results. -RPR was 1:1 in ER, but TPA was negative. This could represent a false positive or early syphilis infection. Will repeat today. Doubt syphilis infection cause of vulvar lesions as these lesions have been quite painful and are  more wide spread on her vulva, rather than a painless, solitary lesion. -Patient has had the same (female) sex partner for four years, but they were recently on a break and he had another sex partner. Per her report, he used a condom and has had negative STI testing since that time.      Lauraine Lakes, CNM

## 2024-06-03 ENCOUNTER — Other Ambulatory Visit (HOSPITAL_COMMUNITY)
Admission: RE | Admit: 2024-06-03 | Discharge: 2024-06-03 | Disposition: A | Discharge status: Home / Self Care | Source: Ambulatory Visit | Attending: Obstetrics | Admitting: Obstetrics

## 2024-06-03 ENCOUNTER — Ambulatory Visit: Admitting: Obstetrics

## 2024-06-03 ENCOUNTER — Encounter: Payer: Self-pay | Admitting: Obstetrics

## 2024-06-03 VITALS — BP 115/73 | HR 98 | Ht 59.0 in | Wt 148.0 lb

## 2024-06-03 DIAGNOSIS — Z7689 Persons encountering health services in other specified circumstances: Secondary | ICD-10-CM | POA: Insufficient documentation

## 2024-06-03 DIAGNOSIS — N9089 Other specified noninflammatory disorders of vulva and perineum: Secondary | ICD-10-CM | POA: Diagnosis not present

## 2024-06-03 DIAGNOSIS — Z113 Encounter for screening for infections with a predominantly sexual mode of transmission: Secondary | ICD-10-CM

## 2024-06-03 NOTE — Assessment & Plan Note (Signed)
-  Pictures of vulvar lesions taken and uploaded into Media section of patient chart. She reports that they are improved. She attributes this to the po flagyl  she took for bacterial vaginosis that was diagnosed in the ER. Repeat BV swab today to ensure resolution. 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 Pt has never had any sx like this before, so this is likely a primary outbreak. She is not currently requiring any strategies for comfort/ pain management. HSV IgG was negative. IgM testing is not available. Will repeat IgG today. F/u pending results. -RPR was 1:1 in ER, but TPA was negative. This could represent a false positive or early syphilis infection. Will repeat today. Doubt syphilis infection cause of vulvar lesions as these lesions have been quite painful and are more wide spread on her vulva, rather than a painless, solitary lesion. -Patient has had the same (female) sex partner for four years, but they were recently on a break and he had another sex partner. Per her report, he used a condom and has had negative STI testing since that time.

## 2024-06-04 ENCOUNTER — Ambulatory Visit: Payer: Self-pay

## 2024-06-04 DIAGNOSIS — B9689 Other specified bacterial agents as the cause of diseases classified elsewhere: Secondary | ICD-10-CM

## 2024-06-04 LAB — CERVICOVAGINAL ANCILLARY ONLY
Bacterial Vaginitis (gardnerella): POSITIVE — AB
Comment: NEGATIVE

## 2024-06-04 MED ORDER — METRONIDAZOLE 500 MG PO TABS: 500.0000 mg | ORAL_TABLET | Freq: Two times a day (BID) | ORAL | 0 refills | Status: DC

## 2024-06-05 LAB — T.PALLIDUM AB, TOTAL: Treponema pallidum Antibodies: NONREACTIVE

## 2024-06-05 LAB — HSV 1 AND 2 AB, IGG
HSV 1 Glycoprotein G Ab, IgG: NONREACTIVE
HSV 2 IgG, Type Spec: NONREACTIVE

## 2024-06-05 LAB — SYPHILIS: RPR W/REFLEX TO RPR TITER AND TREPONEMAL ANTIBODIES, TRADITIONAL SCREENING AND DIAGNOSIS ALGORITHM: RPR Ser Ql: NONREACTIVE

## 2024-06-14 ENCOUNTER — Other Ambulatory Visit: Payer: Self-pay

## 2024-06-14 ENCOUNTER — Emergency Department
Admission: EM | Admit: 2024-06-14 | Discharge: 2024-06-14 | Disposition: A | Discharge status: Home / Self Care | Attending: Emergency Medicine | Admitting: Emergency Medicine

## 2024-06-14 DIAGNOSIS — N898 Other specified noninflammatory disorders of vagina: Secondary | ICD-10-CM | POA: Diagnosis present

## 2024-06-14 DIAGNOSIS — E109 Type 1 diabetes mellitus without complications: Secondary | ICD-10-CM | POA: Insufficient documentation

## 2024-06-14 DIAGNOSIS — B3731 Acute candidiasis of vulva and vagina: Secondary | ICD-10-CM | POA: Diagnosis not present

## 2024-06-14 LAB — URINALYSIS, ROUTINE W REFLEX MICROSCOPIC
Bilirubin Urine: NEGATIVE
Glucose, UA: >=500 mg/dL — AB
Hgb urine dipstick: NEGATIVE
Ketones, ur: 80 mg/dL — AB
Nitrite: NEGATIVE
Protein, ur: 30 mg/dL — AB
Specific Gravity, Urine: 1.021 (ref 1.005–1.030)
pH: 5 (ref 5.0–8.0)

## 2024-06-14 LAB — CHLAMYDIA/NGC RT PCR (ARMC ONLY)
Chlamydia Tr: NOT DETECTED
N gonorrhoeae: NOT DETECTED

## 2024-06-14 LAB — WET PREP, GENITAL
Clue Cells Wet Prep HPF POC: NONE SEEN
Sperm: NONE SEEN
Trich, Wet Prep: NONE SEEN
WBC, Wet Prep HPF POC: <10 (ref <10)

## 2024-06-14 LAB — POC URINE PREG, ED: Preg Test, Ur: NEGATIVE

## 2024-06-14 MED ORDER — VALACYCLOVIR HCL 500 MG PO TABS
500.0000 mg | ORAL_TABLET | Freq: Once | ORAL | Status: AC
  Administered 2024-06-14: 500 mg via ORAL
  Filled 2024-06-14: qty 1

## 2024-06-14 MED ORDER — CLOTRIMAZOLE 1 % VA CREA: 1.0000 | TOPICAL_CREAM | Freq: Every day | VAGINAL | 0 refills | Status: AC

## 2024-06-14 MED ORDER — FLUCONAZOLE 50 MG PO TABS
150.0000 mg | ORAL_TABLET | Freq: Once | ORAL | Status: AC
  Administered 2024-06-14: 150 mg via ORAL
  Filled 2024-06-14: qty 1

## 2024-06-14 MED ORDER — VALACYCLOVIR HCL 500 MG PO TABS: 500.0000 mg | ORAL_TABLET | Freq: Two times a day (BID) | ORAL | 0 refills | Status: AC

## 2024-06-14 NOTE — Discharge Instructions (Signed)
 You were seen in the emergency department for a vaginal yeast infection. Due to the lesions on your vaginal area, I suspect you have a viral infection as well. I will treat you for both.  I will call you if any other results are positive.  Please pick up and take medications as prescribed.  Please have any partners tested for HSV as well.  Please follow-up with your OB/GYN or in the emergency department in 3 days if you are still having vaginal discharge.  You may return to the emergency department for any new, worsening or concerning symptoms.

## 2024-06-14 NOTE — ED Provider Notes (Signed)
 [facility] Provider Note    Event Date/Time   First MD Initiated Contact with Patient 06/14/24 2015     (approximate)   History   No chief complaint on file.   HPI  Krista Gill is a 23 y.o. female  with a past medical history of type 1 diabetes, sickle cell trait presents to the emergency department with vaginal lesions x 2 days. States there are about 3 lesions and she has burning pain with them, especially with urinating.  She reports she shaves her vaginal area roughly every 4 to 5 days.  Patient states she has not been sexually active recently.  Denies fever, abdominal pain, pelvic pain, back pain, dysuria, increased urinary frequency, vaginal bleeding, vaginal discharge.  Reports she has been putting diaper powder on her vaginal area due to the pain.  Reviewed note from 05-18-2024 where patient was seen in this emergency department, had clue cells present on wet prep consistent with BV but was given valacyclovir  only.  Review of OB note from 06-03-2024 seems to contradict the ER note and say patient was treated with Flagyl  but could not find this in past documentation. Patient was prescribed flagyl  on 06/04/2024 and completed treatment.   Physical Exam   Triage Vital Signs: ED Triage Vitals [06/14/24 1814]  Encounter Vitals Group     BP (!) 146/113     Girls Systolic BP Percentile      Girls Diastolic BP Percentile      Boys Systolic BP Percentile      Boys Diastolic BP Percentile      Pulse Rate (!) 120     Resp 18     Temp 98 F (36.7 C)     Temp Source Oral     SpO2 98 %     Weight      Height      Head Circumference      Peak Flow      Pain Score 6     Pain Loc      Pain Education      Exclude from Growth Chart     Most recent vital signs: Vitals:   06/14/24 2150 06/14/24 2325  BP: 132/85 135/89  Pulse: (!) 112 98  Resp: 19 18  Temp: 98.4 F (36.9 C) 98.1 F (36.7 C)  SpO2: 100% 99%    General: Awake, in no acute  distress. Appears stated age. CV: Good peripheral perfusion.  Respiratory:Normal respiratory effort.  No respiratory distress.  GI: Soft, non-distended, non-tender.  Skin:Warm, dry, intact.  GU: Chaperone Nurse Tech present for exam.  Multiple scattered, tender vesicular lesions present on labia majora and minora with overlying curd-like discharge present. No areas of erythema, fluctuance, or bleeding. Patient denied cervical/speculum exam at this time.  ED Results / Procedures / Treatments   Labs (all labs ordered are listed, but only abnormal results are displayed) Labs Reviewed  WET PREP, GENITAL - Abnormal; Notable for the following components:      Result Value   Yeast Wet Prep HPF POC PRESENT (*)    All other components within normal limits  URINALYSIS, ROUTINE W REFLEX MICROSCOPIC - Abnormal; Notable for the following components:   Color, Urine YELLOW (*)    APPearance HAZY (*)    Glucose, UA >=500 (*)    Ketones, ur 80 (*)    Protein, ur 30 (*)    Leukocytes,Ua TRACE (*)    Bacteria, UA RARE (*)  All other components within normal limits  CHLAMYDIA/NGC RT PCR ([facility] ONLY)            RPR  HSV 2 ANTIBODY, IGG  T.PALLIDUM AB, TOTAL  HIV ANTIBODY (ROUTINE TESTING W REFLEX)  POC URINE PREG, ED     EKG     RADIOLOGY    PROCEDURES:  Critical Care performed: No   Procedures   MEDICATIONS ORDERED IN ED: Medications  fluconazole (DIFLUCAN) tablet 150 mg (150 mg Oral Given 06/14/24 2326)  valACYclovir  (VALTREX ) tablet 500 mg (500 mg Oral Given 06/14/24 2326)     IMPRESSION / MDM / ASSESSMENT AND PLAN / ED COURSE  I reviewed the triage vital signs and the nursing notes.                              Differential diagnosis includes, but is not limited to, vaginal candidiasis, HSV 2 vs 1, UTI, syphilis, gonorrhea, chlamydia, BV, folliculitis  Patient's presentation is most consistent with acute complicated illness / injury requiring diagnostic  workup.  Patient is well-appearing, alert at this time.  Please see details of genitourinary exam above.  Patient denied speculum/cervical exam at this time although it was encouraged.  Patient's physical exam seems most consistent with HSV lesions with overlying vaginal candidiasis given the dried cottage cheese/curd-like discharge present throughout her labia majora and minora and history of type 1 diabetes as well as significant glucosuria.  UPT is negative.  UA with glucosuria greater than 500, ketones of 80, proteinuria but can be consistent with her type 1 diabetes as well as trace leukocytes.  Past UAs completed yield similar results.  Patient is not having any dysuria or increased urinary frequency at this time.  Wet prep reveals yeast, confirming vaginal candidiasis.  Negative for chlamydia and gonorrhea.  RPR, HSV-2 antibody IgG, Trep palladium antibody and HIV antibody all are in process at this time.  Told the patient I would call her or she can check MyChart if any of these results are positive.  Will treat her for HSV and vaginal candidiasis.  1 dose of valacyclovir  given here and 1 dose of fluconazole.  Valcyclovir prescription written. Given her immunocompromised status, I did also prescribe clotrimazole vaginal cream.  Patient should follow-up with her OB/GYN outpatient following today's visit.  The patient may return to the emergency department for any new, worsening, or concerning symptoms. Patient was given the opportunity to ask questions; all questions were answered. Emergency department return precautions were discussed with the patient.  Patient is in agreement to the treatment plan.  Patient is stable for discharge.   FINAL CLINICAL IMPRESSION(S) / ED DIAGNOSES   Final diagnoses:  Candidiasis of genitalia in female  Vaginal lesion     Rx / DC Orders   ED Discharge Orders          Ordered    valACYclovir  (VALTREX ) 500 MG tablet  2 times daily        06/14/24 2246     clotrimazole (GYNE-LOTRIMIN) 1 % vaginal cream  Daily at bedtime        06/14/24 2246             Note:  This document was prepared using Dragon voice recognition software and may include unintentional dictation errors.     Sheron Salm, PA-C 06/14/24 7660    Levander Slate, MD 06/15/24 [phone]

## 2024-06-14 NOTE — ED Triage Notes (Signed)
 Pt to ED via POV from home. Pt reports vaginal sores that popped up on 9/9 was seen with negative tested but was placed on medicine for precautions. Pt reports sores have came back a day ago.

## 2024-06-15 LAB — HIV ANTIBODY (ROUTINE TESTING W REFLEX): HIV Screen 4th Generation wRfx: NONREACTIVE

## 2024-06-15 LAB — RPR: RPR Ser Ql: NONREACTIVE

## 2024-06-16 ENCOUNTER — Ambulatory Visit: Payer: Self-pay

## 2024-06-16 LAB — HSV 2 ANTIBODY, IGG: HSV 2 Glycoprotein G Ab, IgG: NONREACTIVE

## 2024-06-16 LAB — T.PALLIDUM AB, TOTAL: T Pallidum Abs: NONREACTIVE

## 2024-08-17 ENCOUNTER — Encounter: Payer: Self-pay | Admitting: *Deleted

## 2024-08-17 ENCOUNTER — Inpatient Hospital Stay
Admission: EM | Admit: 2024-08-17 | Discharge: 2024-08-18 | DRG: 639 | Disposition: A | Discharge status: Home / Self Care | Attending: Osteopathic Medicine | Admitting: Osteopathic Medicine

## 2024-08-17 ENCOUNTER — Other Ambulatory Visit: Payer: Self-pay

## 2024-08-17 DIAGNOSIS — Z91018 Allergy to other foods: Secondary | ICD-10-CM | POA: Diagnosis not present

## 2024-08-17 DIAGNOSIS — F1290 Cannabis use, unspecified, uncomplicated: Secondary | ICD-10-CM | POA: Diagnosis present

## 2024-08-17 DIAGNOSIS — D72829 Elevated white blood cell count, unspecified: Secondary | ICD-10-CM | POA: Diagnosis present

## 2024-08-17 DIAGNOSIS — Z833 Family history of diabetes mellitus: Secondary | ICD-10-CM | POA: Diagnosis not present

## 2024-08-17 DIAGNOSIS — E1110 Type 2 diabetes mellitus with ketoacidosis without coma: Secondary | ICD-10-CM | POA: Diagnosis present

## 2024-08-17 DIAGNOSIS — E1010 Type 1 diabetes mellitus with ketoacidosis without coma: Secondary | ICD-10-CM | POA: Diagnosis present

## 2024-08-17 DIAGNOSIS — Z91148 Patient's other noncompliance with medication regimen for other reason: Secondary | ICD-10-CM | POA: Diagnosis not present

## 2024-08-17 DIAGNOSIS — D573 Sickle-cell trait: Secondary | ICD-10-CM | POA: Diagnosis present

## 2024-08-17 DIAGNOSIS — Z832 Family history of diseases of the blood and blood-forming organs and certain disorders involving the immune mechanism: Secondary | ICD-10-CM | POA: Diagnosis not present

## 2024-08-17 DIAGNOSIS — Z793 Long term (current) use of hormonal contraceptives: Secondary | ICD-10-CM | POA: Diagnosis not present

## 2024-08-17 DIAGNOSIS — R7989 Other specified abnormal findings of blood chemistry: Secondary | ICD-10-CM | POA: Diagnosis not present

## 2024-08-17 DIAGNOSIS — Z794 Long term (current) use of insulin: Secondary | ICD-10-CM | POA: Diagnosis not present

## 2024-08-17 DIAGNOSIS — Z79899 Other long term (current) drug therapy: Secondary | ICD-10-CM | POA: Diagnosis not present

## 2024-08-17 DIAGNOSIS — R109 Unspecified abdominal pain: Secondary | ICD-10-CM | POA: Diagnosis present

## 2024-08-17 LAB — URINALYSIS, ROUTINE W REFLEX MICROSCOPIC
Bilirubin Urine: NEGATIVE
Glucose, UA: >=500 mg/dL — AB
Hgb urine dipstick: NEGATIVE
Ketones, ur: 80 mg/dL — AB
Leukocytes,Ua: NEGATIVE
Nitrite: NEGATIVE
Protein, ur: 100 mg/dL — AB
Specific Gravity, Urine: 1.017 (ref 1.005–1.030)
pH: 5 (ref 5.0–8.0)

## 2024-08-17 LAB — COMPREHENSIVE METABOLIC PANEL WITH GFR
ALT: 27 U/L (ref 0–44)
AST: 34 U/L (ref 15–41)
Albumin: 5.1 g/dL — ABNORMAL HIGH (ref 3.5–5.0)
Alkaline Phosphatase: 134 U/L — ABNORMAL HIGH (ref 38–126)
BUN: 14 mg/dL (ref 6–20)
CO2: <7 mmol/L — ABNORMAL LOW (ref 22–32)
Calcium: 10.2 mg/dL (ref 8.9–10.3)
Chloride: 98 mmol/L (ref 98–111)
Creatinine, Ser: 0.87 mg/dL (ref 0.44–1.00)
GFR, Estimated: >60 mL/min (ref >60)
Glucose, Bld: 398 mg/dL — ABNORMAL HIGH (ref 70–99)
Potassium: 4.6 mmol/L (ref 3.5–5.1)
Sodium: 137 mmol/L (ref 135–145)
Total Bilirubin: 0.4 mg/dL (ref 0.0–1.2)
Total Protein: 9.9 g/dL — ABNORMAL HIGH (ref 6.5–8.1)

## 2024-08-17 LAB — CBC
HCT: 42.7 % (ref 36.0–46.0)
Hemoglobin: 11.3 g/dL — ABNORMAL LOW (ref 12.0–15.0)
MCH: 18.7 pg — ABNORMAL LOW (ref 26.0–34.0)
MCHC: 26.5 g/dL — ABNORMAL LOW (ref 30.0–36.0)
MCV: 70.8 fL — ABNORMAL LOW (ref 80.0–100.0)
Platelets: 677 K/uL — ABNORMAL HIGH (ref 150–400)
RBC: 6.03 MIL/uL — ABNORMAL HIGH (ref 3.87–5.11)
RDW: 20.2 % — ABNORMAL HIGH (ref 11.5–15.5)
WBC: 15.2 K/uL — ABNORMAL HIGH (ref 4.0–10.5)
nRBC: 0 % (ref 0.0–0.2)

## 2024-08-17 LAB — URINE DRUG SCREEN
Amphetamines: NEGATIVE
Barbiturates: NEGATIVE
Benzodiazepines: NEGATIVE
Cocaine: NEGATIVE
Fentanyl: NEGATIVE
Methadone Scn, Ur: NEGATIVE
Opiates: NEGATIVE
Tetrahydrocannabinol: POSITIVE — AB

## 2024-08-17 LAB — CBG MONITORING, ED
Glucose-Capillary: 152 mg/dL — ABNORMAL HIGH (ref 70–99)
Glucose-Capillary: 210 mg/dL — ABNORMAL HIGH (ref 70–99)
Glucose-Capillary: 384 mg/dL — ABNORMAL HIGH (ref 70–99)

## 2024-08-17 LAB — BETA-HYDROXYBUTYRIC ACID: Beta-Hydroxybutyric Acid: 7.18 mmol/L — ABNORMAL HIGH (ref 0.05–0.27)

## 2024-08-17 LAB — LIPASE, BLOOD: Lipase: 11 U/L (ref 11–51)

## 2024-08-17 LAB — LACTIC ACID, PLASMA: Lactic Acid, Venous: 2.6 mmol/L (ref 0.5–1.9)

## 2024-08-17 MED ORDER — DEXTROSE IN LACTATED RINGERS 5 % IV SOLN: INTRAVENOUS | Status: DC

## 2024-08-17 MED ORDER — SODIUM CHLORIDE 0.9 % IV BOLUS
1000.0000 mL | Freq: Once | INTRAVENOUS | Status: AC
  Administered 2024-08-17: 1000 mL via INTRAVENOUS

## 2024-08-17 MED ORDER — POTASSIUM CHLORIDE 10 MEQ/100ML IV SOLN
10.0000 meq | INTRAVENOUS | Status: AC
  Administered 2024-08-17 (×2): 10 meq via INTRAVENOUS
  Filled 2024-08-17 (×2): qty 100

## 2024-08-17 MED ORDER — ONDANSETRON HCL 4 MG/2ML IJ SOLN
4.0000 mg | Freq: Once | INTRAMUSCULAR | Status: AC
  Administered 2024-08-17: 4 mg via INTRAVENOUS
  Filled 2024-08-17: qty 2

## 2024-08-17 MED ORDER — LACTATED RINGERS IV BOLUS
2000.0000 mL | Freq: Once | INTRAVENOUS | Status: AC
  Administered 2024-08-17: 2000 mL via INTRAVENOUS

## 2024-08-17 MED ORDER — ONDANSETRON HCL 4 MG/2ML IJ SOLN
4.0000 mg | Freq: Three times a day (TID) | INTRAMUSCULAR | Status: DC | PRN
  Administered 2024-08-18: 4 mg via INTRAVENOUS
  Filled 2024-08-17: qty 2

## 2024-08-17 MED ORDER — LACTATED RINGERS IV SOLN: INTRAVENOUS | Status: DC

## 2024-08-17 MED ORDER — ACETAMINOPHEN 325 MG PO TABS: 650.0000 mg | ORAL_TABLET | Freq: Four times a day (QID) | ORAL | Status: DC | PRN

## 2024-08-17 MED ORDER — DEXTROSE 50 % IV SOLN: 0.0000 mL | INTRAVENOUS | Status: DC | PRN

## 2024-08-17 MED ORDER — INSULIN REGULAR(HUMAN) IN NACL 100-0.9 UT/100ML-% IV SOLN: INTRAVENOUS | Status: DC

## 2024-08-17 MED ORDER — INSULIN REGULAR(HUMAN) IN NACL 100-0.9 UT/100ML-% IV SOLN
INTRAVENOUS | Status: DC
  Administered 2024-08-17: 4.2 [IU]/h via INTRAVENOUS
  Filled 2024-08-17: qty 100

## 2024-08-17 MED ORDER — LACTATED RINGERS IV BOLUS: 20.0000 mL/kg | Freq: Once | INTRAVENOUS | Status: DC

## 2024-08-17 MED ORDER — ENOXAPARIN SODIUM 40 MG/0.4ML IJ SOSY
40.0000 mg | PREFILLED_SYRINGE | INTRAMUSCULAR | Status: DC
  Administered 2024-08-18: 40 mg via SUBCUTANEOUS
  Filled 2024-08-17: qty 0.4

## 2024-08-17 MED ORDER — FAMOTIDINE IN NACL 20-0.9 MG/50ML-% IV SOLN
20.0000 mg | Freq: Once | INTRAVENOUS | Status: AC
  Administered 2024-08-17: 20 mg via INTRAVENOUS
  Filled 2024-08-17: qty 50

## 2024-08-17 NOTE — H&P (Incomplete)
 History and Physical    ANICA ALCARAZ FMW:[id number] DOB: Jun 24, 2001 DOA: 08/17/2024  Referring MD/NP/PA:   PCP: Pcp, No   Patient coming from:  The patient is coming from home.     Chief Complaint: Nausea, dry heaves, lower abdominal pain  HPI: Krista Gill is a 23 y.o. female with medical history significant of  type 1 diabetes, sickle cell trait, DKA, marijuana abuse, medication noncompliance in  the past, who presents with nausea, dry heaves, lower abdominal pain.  Patient states that her symptoms started last night, including nausea, dry heaves, lower abdominal pain.  No diarrhea, fever or chills.  Her lower abdominal pain is mild, aching, nonradiating, constant, not aggravated or alleviated by any known factors.  No chest pain, cough, SOB.  No symptoms of UTI.  Patient states that she is taking NovoLog  and Tresiba  64 units daily.  States that she took her Tresiba  this morning.  Data reviewed independently and ED Course: pt was found to have WBC 15.2, lactic acid 2.6, DKA (bicarbonate <7.0, positive ketones 80 in the urine, beta hydroxybutyric acid 7.18, blood sugar 398 by BMP), positive UDS for tetrahydrocannabinol, GFR> 60.  Temperature normal, blood pressure 136/103, heart rate of 121, RR 18, oxygen saturation 98% on room air.  Patient is admitted to stepdown as inpatient.   EKG:  Not done in ED, will get one.    Review of Systems:   General: no fevers, chills, no body weight gain, has poor appetite, has fatigue HEENT: no blurry vision, hearing changes or sore throat Respiratory: no dyspnea, coughing, wheezing CV: no chest pain, no palpitations GI: Has nausea, dry heaves, lower abdominal pain, no diarrhea GU: no dysuria, burning on urination, increased urinary frequency, hematuria  Ext: no leg edema Neuro: no unilateral weakness, numbness, or tingling, no vision change or hearing loss Skin: no rash, no skin tear. MSK: No muscle spasm, no deformity, no limitation  of range of movement in spin Heme: No easy bruising.  Travel history: No recent long distant travel.   Allergy:  Allergies  Allergen Reactions  . Pineapple Other (See Comments)    Itchy tongue/throat    Past Medical History:  Diagnosis Date  . Diabetes mellitus without complication (HCC)   . Diabetic ketoacidosis (HCC)   . Sickle cell trait   . Urinary tract infection     Past Surgical History:  Procedure Laterality Date  . ADENOIDECTOMY    . MYRINGOTOMY    . TONSILLECTOMY      Social History:  reports that she has never smoked. She has been exposed to tobacco smoke. She has never used smokeless tobacco. She reports current drug use. Drug: Marijuana. She reports that she does not drink alcohol.  Family History:  Family History  Problem Relation Age of Onset  . Sickle cell trait Mother   . Diabetes Paternal Grandmother   . Thyroid disease Paternal Uncle      Prior to Admission medications   Medication Sig Start Date End Date Taking? Authorizing Provider  insulin  aspart (NOVOLOG ) 100 UNIT/ML FlexPen Inject 11 Units into the skin 3 (three) times daily with meals. Sliding Scale +1 for every 15 over 06/05/23 06/03/24  Maree Hue, MD  metroNIDAZOLE  (FLAGYL ) 500 MG tablet Take 1 tablet (500 mg total) by mouth 2 (two) times daily. 06/04/24   Charma Domino, CNM  norethindrone  (ORTHO MICRONOR ) 0.35 MG tablet Take 1 tablet (0.35 mg total) by mouth daily. 12/02/23   Idol, Janet L, NP  potassium  chloride SA (KLOR-CON  M) 20 MEQ tablet Take 1 tablet (20 mEq total) by mouth 2 (two) times daily for 3 days. 10/11/23 06/03/24  Ernest Ronal BRAVO, MD    Physical Exam: Vitals:   08/17/24 1804 08/17/24 2200  BP: (!) 136/103   Pulse: (!) 121   Resp: 18   Temp: 97.8 F (36.6 C)   SpO2: 98%   Weight:  59.5 kg   General: Not in acute distress.  Dry mucous membrane. HEENT:       Eyes: PERRL, EOMI, no jaundice       ENT: No discharge from the ears and nose, no pharynx injection, no tonsillar  enlargement.        Neck: No JVD, no bruit, no mass felt. Heme: No neck lymph node enlargement. Cardiac: S1/S2, RRR, No murmurs, No gallops or rubs. Respiratory: No rales, wheezing, rhonchi or rubs. GI: Soft, nondistended, has very mild lower abdominal tenderness, no rebound pain, no organomegaly, BS present. GU: No hematuria Ext: No pitting leg edema bilaterally. 1+DP/PT pulse bilaterally. Musculoskeletal: No joint deformities, No joint redness or warmth, no limitation of ROM in spin. Skin: No rashes.  Neuro: Alert, oriented X3, cranial nerves II-XII grossly intact, moves all extremities normally.  Psych: Patient is not psychotic, no suicidal or hemocidal ideation.  Labs on Admission: I have personally reviewed following labs and imaging studies  CBC: Recent Labs  Lab 08/17/24 1820  WBC 15.2*  HGB 11.3*  HCT 42.7  MCV 70.8*  PLT 677*   Basic Metabolic Panel: Recent Labs  Lab 08/17/24 1820  NA 137  K 4.6  CL 98  CO2 <7*  GLUCOSE 398*  BUN 14  CREATININE 0.87  CALCIUM 10.2   GFR: Estimated Creatinine Clearance: 80.3 mL/min (by C-G formula based on SCr of 0.87 mg/dL). Liver Function Tests: Recent Labs  Lab 08/17/24 1820  AST 34  ALT 27  ALKPHOS 134*  BILITOT 0.4  PROT 9.9*  ALBUMIN 5.1*   Recent Labs  Lab 08/17/24 1820  LIPASE 11   No results for input(s): AMMONIA in the last 168 hours. Coagulation Profile: No results for input(s): INR, PROTIME in the last 168 hours. Cardiac Enzymes: No results for input(s): CKTOTAL, CKMB, CKMBINDEX, TROPONINI in the last 168 hours. BNP (last 3 results) No results for input(s): PROBNP in the last 8760 hours. HbA1C: No results for input(s): HGBA1C in the last 72 hours. CBG: Recent Labs  Lab 08/17/24 1807 08/17/24 2250 08/17/24 2357  GLUCAP 384* 210* 152*   Lipid Profile: No results for input(s): CHOL, HDL, LDLCALC, TRIG, CHOLHDL, LDLDIRECT in the last 72 hours. Thyroid Function  Tests: No results for input(s): TSH, T4TOTAL, FREET4, T3FREE, THYROIDAB in the last 72 hours. Anemia Panel: No results for input(s): VITAMINB12, FOLATE, FERRITIN, TIBC, IRON, RETICCTPCT in the last 72 hours. Urine analysis:    Component Value Date/Time   COLORURINE STRAW (A) 08/17/2024 1816   APPEARANCEUR CLEAR (A) 08/17/2024 1816   APPEARANCEUR Hazy 12/13/2011 0800   LABSPEC 1.017 08/17/2024 1816   LABSPEC 1.021 12/13/2011 0800   PHURINE 5.0 08/17/2024 1816   GLUCOSEU >=500 (A) 08/17/2024 1816   GLUCOSEU >=500 12/13/2011 0800   HGBUR NEGATIVE 08/17/2024 1816   BILIRUBINUR NEGATIVE 08/17/2024 1816   BILIRUBINUR Negative 12/13/2011 0800   KETONESUR 80 (A) 08/17/2024 1816   PROTEINUR 100 (A) 08/17/2024 1816   NITRITE NEGATIVE 08/17/2024 1816   LEUKOCYTESUR NEGATIVE 08/17/2024 1816   LEUKOCYTESUR Negative 12/13/2011 0800   Sepsis Labs: @LABRCNTIP (procalcitonin:4,lacticidven:4) )No results  found for this or any previous visit (from the past 240 hours).   Radiological Exams on Admission:   Assessment/Plan Principal Problem:   DKA (diabetic ketoacidosis) (HCC) Active Problems:   Type 1 diabetes mellitus with ketoacidosis, uncontrolled (HCC)   Marijuana use   Leukocytosis   Elevated lactic acid level   Abdominal pain   Assessment and Plan:   DKA (diabetic ketoacidosis) (HCC): bicarbonate <7.0, beta hydroxybutyric acid 7.18, positive ketones 80 in the urine and blood sugar 398 by BMP.         Active Problems:   Type 1 diabetes mellitus with ketoacidosis, uncontrolled (HCC)   Marijuana use   Leukocytosis   Elevated lactic acid level   Abdominal pain        DVT ppx: SQ Lovenox   Code Status: Full code    Family Communication:   Yes, patient's mother at bed side.     Disposition Plan:  Anticipate discharge back to previous environment  Consults called: None  Admission status and Level of care: Stepdown:  as inpt        Dispo:  The patient is from: Home              Anticipated d/c is to: Home              Anticipated d/c date is: 2 days              Patient currently is not medically stable to d/c.    Severity of Illness:  The appropriate patient status for this patient is INPATIENT. Inpatient status is judged to be reasonable and necessary in order to provide the required intensity of service to ensure the patient's safety. The patient's presenting symptoms, physical exam findings, and initial radiographic and laboratory data in the context of their chronic comorbidities is felt to place them at high risk for further clinical deterioration. Furthermore, it is not anticipated that the patient will be medically stable for discharge from the hospital within 2 midnights of admission.   * I certify that at the point of admission it is my clinical judgment that the patient will require inpatient hospital care spanning beyond 2 midnights from the point of admission due to high intensity of service, high risk for further deterioration and high frequency of surveillance required.*       Date of Service 08/18/2024    Caleb Exon [facility]   If 7PM-7AM, please contact night-coverage [email or web address] 08/18/2024, 12:02 AM

## 2024-08-17 NOTE — ED Notes (Signed)
 Fsbs 384 in triage

## 2024-08-17 NOTE — ED Provider Notes (Signed)
 [facility] Emergency Department Provider Note     Event Date/Time   First MD Initiated Contact with Patient 08/17/24 1954     (approximate)   History   Emesis   HPI  Krista Gill is a 23 y.o. female type 1 diabetes, sickle cell trait, marijuana use presenting with dry heaves and nausea since last night.  She took Zofran  and about an hour prior to reporting to the ED, but continues to endorse symptoms.  She denies any associated fevers, chest pain, or shortness of breath.  She does endorse some mild lower abdominal discomfort.   Physical Exam   Triage Vital Signs: ED Triage Vitals  Encounter Vitals Group     BP 08/17/24 1804 (!) 136/103     Girls Systolic BP Percentile --      Girls Diastolic BP Percentile --      Boys Systolic BP Percentile --      Boys Diastolic BP Percentile --      Pulse Rate 08/17/24 1804 (!) 121     Resp 08/17/24 1804 18     Temp 08/17/24 1804 97.8 F (36.6 C)     Temp src --      SpO2 08/17/24 1804 98 %     Weight --      Height --      Head Circumference --      Peak Flow --      Pain Score 08/17/24 1814 6     Pain Loc --      Pain Education --      Exclude from Growth Chart --     Most recent vital signs: Vitals:   08/17/24 1804  BP: (!) 136/103  Pulse: (!) 121  Resp: 18  Temp: 97.8 F (36.6 C)  SpO2: 98%    General Awake, no distress. NAD HEENT NCAT. PERRL. EOMI. No rhinorrhea. Mucous membranes are moist.  CV:  Good peripheral perfusion. Sinus tachycardia.  No murmurs, rubs, or gallops.  Normal S1-S2 RESP:  Normal effort. CTA ABD:  No distention.  Soft and nontender.  No rebound, guarding, or rigidity noted.  No organomegaly appreciated.  No CV tenderness elicited MSK:  AROM of all extremities   ED Results / Procedures / Treatments   Labs (all labs ordered are listed, but only abnormal results are displayed) Labs Reviewed  COMPREHENSIVE METABOLIC PANEL WITH GFR - Abnormal; Notable for the  following components:      Result Value   CO2 <7 (*)    Glucose, Bld 398 (*)    Total Protein 9.9 (*)    Albumin 5.1 (*)    Alkaline Phosphatase 134 (*)    All other components within normal limits  CBC - Abnormal; Notable for the following components:   WBC 15.2 (*)    RBC 6.03 (*)    Hemoglobin 11.3 (*)    MCV 70.8 (*)    MCH 18.7 (*)    MCHC 26.5 (*)    RDW 20.2 (*)    Platelets 677 (*)    All other components within normal limits  URINALYSIS, ROUTINE W REFLEX MICROSCOPIC - Abnormal; Notable for the following components:   Color, Urine STRAW (*)    APPearance CLEAR (*)    Glucose, UA >=500 (*)    Ketones, ur 80 (*)    Protein, ur 100 (*)    Bacteria, UA RARE (*)    All other components within normal limits  URINE DRUG  SCREEN - Abnormal; Notable for the following components:   Tetrahydrocannabinol POSITIVE (*)    All other components within normal limits  LACTIC ACID, PLASMA - Abnormal; Notable for the following components:   Lactic Acid, Venous 2.6 (*)    All other components within normal limits  CBG MONITORING, ED - Abnormal; Notable for the following components:   Glucose-Capillary 384 (*)    All other components within normal limits  LIPASE, BLOOD  LACTIC ACID, PLASMA  BETA-HYDROXYBUTYRIC ACID  BLOOD GAS, VENOUS  BASIC METABOLIC PANEL WITH GFR  BASIC METABOLIC PANEL WITH GFR  BASIC METABOLIC PANEL WITH GFR  BASIC METABOLIC PANEL WITH GFR  CBC  POC URINE PREG, ED  CBG MONITORING, ED    EKG    RADIOLOGY  No results found.   PROCEDURES:  Critical Care performed: Yes   CRITICAL CARE  Performed by: Candida LULLA Bast   Total critical care time: 30 minutes  Critical care time was exclusive of separately billable procedures and treating other patients.  Critical care was necessary to treat or prevent imminent or life-threatening deterioration.  Critical care was time spent personally by me on the following activities: development of  treatment plan with patient and/or surrogate as well as nursing, discussions with consultants, evaluation of patient's response to treatment, examination of patient, obtaining history from patient or surrogate, ordering and performing treatments and interventions, ordering and review of laboratory studies, ordering and review of radiographic studies, pulse oximetry and re-evaluation of patient's condition.  Procedures   MEDICATIONS ORDERED IN ED: Medications  insulin  regular, human (MYXREDLIN ) 100 units/ 100 mL infusion (has no administration in time range)  lactated ringers  infusion (has no administration in time range)  dextrose  5 % in lactated ringers  infusion (has no administration in time range)  potassium chloride  10 mEq in 100 mL IVPB (has no administration in time range)  ondansetron  (ZOFRAN ) injection 4 mg (has no administration in time range)  acetaminophen  (TYLENOL ) tablet 650 mg (has no administration in time range)  lactated ringers  bolus 2,000 mL (has no administration in time range)  enoxaparin  (LOVENOX ) injection 40 mg (has no administration in time range)  dextrose  50 % solution 0-50 mL (has no administration in time range)  sodium chloride  0.9 % bolus 1,000 mL ( Intravenous Restarted 08/17/24 2135)  ondansetron  (ZOFRAN ) injection 4 mg (4 mg Intravenous Given 08/17/24 2056)  famotidine  (PEPCID ) IVPB 20 mg premix (0 mg Intravenous Stopped 08/17/24 2207)     IMPRESSION / MDM / ASSESSMENT AND PLAN / ED COURSE  I reviewed the triage vital signs and the nursing notes.                              Differential diagnosis includes, but is not limited to, DKA, HHS, metabolic acidosis, electrolyte abnormality, dehydration, gastroenteritis, sepsis  Patient's presentation is most consistent with acute complicated illness / injury requiring diagnostic workup.  Patient's diagnosis is consistent with DKA.  Patient to the ED for evaluation of poor p.o. intake with nausea and dry heaves  in the face of type 1 diabetes.  Patient denies any missed medication doses, sick contacts, or fevers.  She is afebrile on presentation, found to be tachycardic, with elevated white count of 15.  Critical lactic noted at 2.6.  AG calculated at 32.  Patient will be admitted to the hospital service for ongoing management of her DKA.  Hyperglycemia protocol initiated including Endo tool.  Initial fluid  boluses as well as antiemetics provided.  Patient is agreeable to plan of admission.  Clinical Course as of 08/17/24 2225  Tue Aug 17, 2024  2114 Patient with elevated white count of 15.2 and glucose elevated 398.  Anion gap is not reported by the lab calculated due to the patient's bicarb of less than 7, but calculated to 32 [JM]  2138 Lab called to report patient with critical lactic acid of 2.6. [JM]  2139 Hyperglycemia/DKA protocol initiated with Endo tool. [JM]  2217 Dr. Hilma has excepted patient to the hospital service as suggested for management of her DKA. [JM]    Clinical Course User Index [JM] Toshiro Hanken, Candida LULLA Kings, PA-C    FINAL CLINICAL IMPRESSION(S) / ED DIAGNOSES   Final diagnoses:  Type 1 diabetes mellitus with ketoacidosis without coma (HCC)     Rx / DC Orders   ED Discharge Orders     None        Note:  This document was prepared using Dragon voice recognition software and may include unintentional dictation errors.    Loyd Candida LULLA Kings, PA-C 08/17/24 2225    Nicholaus Rolland BRAVO, MD 08/17/24 [phone]

## 2024-08-17 NOTE — H&P (Signed)
 History and Physical    Krista Gill FMW:[id number] DOB: [birth year] DOA: 08/17/2024  Referring MD/NP/PA:   PCP: Pcp, No   Patient coming from:  The patient is coming from home.     Chief Complaint:   HPI: Krista Gill is a 23 y.o. female with medical history significant of      Data reviewed independently and ED Course: pt was found to have     ***       EKG: I have personally reviewed.  Not done in ED, will get one.   ***   Review of Systems:   General: no fevers, chills, no body weight gain, has poor appetite, has fatigue HEENT: no blurry vision, hearing changes or sore throat Respiratory: no dyspnea, coughing, wheezing CV: no chest pain, no palpitations GI: no nausea, vomiting, abdominal pain, diarrhea, constipation GU: no dysuria, burning on urination, increased urinary frequency, hematuria  Ext: no leg edema Neuro: no unilateral weakness, numbness, or tingling, no vision change or hearing loss Skin: no rash, no skin tear. MSK: No muscle spasm, no deformity, no limitation of range of movement in spin Heme: No easy bruising.  Travel history: No recent long distant travel.   Allergy:  Allergies  Allergen Reactions   Pineapple Other (See Comments)    Itchy tongue/throat    Past Medical History:  Diagnosis Date   Diabetes mellitus without complication (HCC)    Diabetic ketoacidosis (HCC)    Sickle cell trait    Urinary tract infection     Past Surgical History:  Procedure Laterality Date   ADENOIDECTOMY     MYRINGOTOMY     TONSILLECTOMY      Social History:  reports that she has never smoked. She has been exposed to tobacco smoke. She has never used smokeless tobacco. She reports current drug use. Drug: Marijuana. She reports that she does not drink alcohol.  Family History:  Family History  Problem Relation Age of Onset   Sickle cell trait Mother    Diabetes Paternal Grandmother    Thyroid disease Paternal Uncle      Prior to  Admission medications   Medication Sig Start Date End Date Taking? Authorizing Provider  insulin  aspart (NOVOLOG ) 100 UNIT/ML FlexPen Inject 11 Units into the skin 3 (three) times daily with meals. Sliding Scale +1 for every 15 over 06/05/23 06/03/24  Maree Hue, MD  metroNIDAZOLE  (FLAGYL ) 500 MG tablet Take 1 tablet (500 mg total) by mouth 2 (two) times daily. 06/04/24   Charma Domino, CNM  norethindrone  (ORTHO MICRONOR ) 0.35 MG tablet Take 1 tablet (0.35 mg total) by mouth daily. 12/02/23   Idol, Janet L, NP  potassium chloride  SA (KLOR-CON  M) 20 MEQ tablet Take 1 tablet (20 mEq total) by mouth 2 (two) times daily for 3 days. 10/11/23 06/03/24  Ernest Ronal BRAVO, MD    Physical Exam: Vitals:   08/17/24 1804 08/17/24 2200  BP: (!) 136/103   Pulse: (!) 121   Resp: 18   Temp: 97.8 F (36.6 C)   SpO2: 98%   Weight:  59.5 kg   General: Not in acute distress HEENT:       Eyes: PERRL, EOMI, no jaundice       ENT: No discharge from the ears and nose, no pharynx injection, no tonsillar enlargement.        Neck: No JVD, no bruit, no mass felt. Heme: No neck lymph node enlargement. Cardiac: S1/S2, RRR, No murmurs, No gallops or rubs. Respiratory:  No rales, wheezing, rhonchi or rubs. GI: Soft, nondistended, nontender, no rebound pain, no organomegaly, BS present. GU: No hematuria Ext: No pitting leg edema bilaterally. 1+DP/PT pulse bilaterally. Musculoskeletal: No joint deformities, No joint redness or warmth, no limitation of ROM in spin. Skin: No rashes.  Neuro: Alert, oriented X3, cranial nerves II-XII grossly intact, moves all extremities normally. Muscle strength 5/5 in all extremities, sensation to light touch intact. Brachial reflex 2+ bilaterally. Knee reflex 1+ bilaterally. Negative Babinski's sign. Normal finger to nose test. Psych: Patient is not psychotic, no suicidal or hemocidal ideation.  Labs on Admission: I have personally reviewed following labs and imaging studies  CBC: Recent  Labs  Lab 08/17/24 1820  WBC 15.2*  HGB 11.3*  HCT 42.7  MCV 70.8*  PLT 677*   Basic Metabolic Panel: Recent Labs  Lab 08/17/24 1820  NA 137  K 4.6  CL 98  CO2 <7*  GLUCOSE 398*  BUN 14  CREATININE 0.87  CALCIUM 10.2   GFR: Estimated Creatinine Clearance: 80.3 mL/min (by C-G formula based on SCr of 0.87 mg/dL). Liver Function Tests: Recent Labs  Lab 08/17/24 1820  AST 34  ALT 27  ALKPHOS 134*  BILITOT 0.4  PROT 9.9*  ALBUMIN 5.1*   Recent Labs  Lab 08/17/24 1820  LIPASE 11   No results for input(s): AMMONIA in the last 168 hours. Coagulation Profile: No results for input(s): INR, PROTIME in the last 168 hours. Cardiac Enzymes: No results for input(s): CKTOTAL, CKMB, CKMBINDEX, TROPONINI in the last 168 hours. BNP (last 3 results) No results for input(s): PROBNP in the last 8760 hours. HbA1C: No results for input(s): HGBA1C in the last 72 hours. CBG: Recent Labs  Lab 08/17/24 1807  GLUCAP 384*   Lipid Profile: No results for input(s): CHOL, HDL, LDLCALC, TRIG, CHOLHDL, LDLDIRECT in the last 72 hours. Thyroid Function Tests: No results for input(s): TSH, T4TOTAL, FREET4, T3FREE, THYROIDAB in the last 72 hours. Anemia Panel: No results for input(s): VITAMINB12, FOLATE, FERRITIN, TIBC, IRON, RETICCTPCT in the last 72 hours. Urine analysis:    Component Value Date/Time   COLORURINE STRAW (A) 08/17/2024 1816   APPEARANCEUR CLEAR (A) 08/17/2024 1816   APPEARANCEUR Hazy 12/13/2011 0800   LABSPEC 1.017 08/17/2024 1816   LABSPEC 1.021 12/13/2011 0800   PHURINE 5.0 08/17/2024 1816   GLUCOSEU >=500 (A) 08/17/2024 1816   GLUCOSEU >=500 12/13/2011 0800   HGBUR NEGATIVE 08/17/2024 1816   BILIRUBINUR NEGATIVE 08/17/2024 1816   BILIRUBINUR Negative 12/13/2011 0800   KETONESUR 80 (A) 08/17/2024 1816   PROTEINUR 100 (A) 08/17/2024 1816   NITRITE NEGATIVE 08/17/2024 1816   LEUKOCYTESUR NEGATIVE 08/17/2024  1816   LEUKOCYTESUR Negative 12/13/2011 0800   Sepsis Labs: @LABRCNTIP (procalcitonin:4,lacticidven:4) )No results found for this or any previous visit (from the past 240 hours).   Radiological Exams on Admission:   Assessment/Plan Active Problems:   * No active hospital problems. *   Assessment and Plan: No notes have been filed under this hospital service. Service: Hospitalist      Active Problems:   * No active hospital problems. *    DVT ppx: SQ Heparin          SQ Lovenox   Code Status: Full code   ***  Family Communication:     not done, no family member is at bed side.              Yes, patient's    at bed side.  by phone   ***  Disposition Plan:  Anticipate discharge back to previous environment  Consults called:    Admission status and Level of care: :    for obs as inpt        Dispo: The patient is from: {From:23814}              Anticipated d/c is to: {To:23815}              Anticipated d/c date is: {Days:23816}              Patient currently {Medically stable:23817}    Severity of Illness:  {Observation/Inpatient:21159}       Date of Service 08/17/2024    Caleb Exon [facility]   If 7PM-7AM, please contact night-coverage [email or web address] 08/17/2024, 10:04 PM

## 2024-08-17 NOTE — ED Triage Notes (Signed)
 Pt ambulatory to triage.  Pt has dry heaves in triage.  Sx began last night.  Pt is diabetic.  Pt denies any pain.  Pt took zofran  odt 1 hour ago and then dry heaves started per pt.  Pt alert  speech clear.

## 2024-08-18 DIAGNOSIS — E1010 Type 1 diabetes mellitus with ketoacidosis without coma: Secondary | ICD-10-CM | POA: Diagnosis not present

## 2024-08-18 LAB — CBG MONITORING, ED
Glucose-Capillary: 130 mg/dL — ABNORMAL HIGH (ref 70–99)
Glucose-Capillary: 143 mg/dL — ABNORMAL HIGH (ref 70–99)
Glucose-Capillary: 178 mg/dL — ABNORMAL HIGH (ref 70–99)
Glucose-Capillary: 165 mg/dL — ABNORMAL HIGH (ref 70–99)
Glucose-Capillary: 167 mg/dL — ABNORMAL HIGH (ref 70–99)
Glucose-Capillary: 154 mg/dL — ABNORMAL HIGH (ref 70–99)
Glucose-Capillary: 153 mg/dL — ABNORMAL HIGH (ref 70–99)
Glucose-Capillary: 162 mg/dL — ABNORMAL HIGH (ref 70–99)
Glucose-Capillary: 145 mg/dL — ABNORMAL HIGH (ref 70–99)
Glucose-Capillary: 155 mg/dL — ABNORMAL HIGH (ref 70–99)
Glucose-Capillary: 153 mg/dL — ABNORMAL HIGH (ref 70–99)
Glucose-Capillary: 126 mg/dL — ABNORMAL HIGH (ref 70–99)

## 2024-08-18 LAB — BASIC METABOLIC PANEL WITH GFR
Anion gap: 23 — ABNORMAL HIGH (ref 5–15)
Anion gap: 19 — ABNORMAL HIGH (ref 5–15)
Anion gap: 15 (ref 5–15)
Anion gap: 16 — ABNORMAL HIGH (ref 5–15)
BUN: 10 mg/dL (ref 6–20)
BUN: 9 mg/dL (ref 6–20)
BUN: 8 mg/dL (ref 6–20)
BUN: 8 mg/dL (ref 6–20)
CO2: 11 mmol/L — ABNORMAL LOW (ref 22–32)
CO2: 14 mmol/L — ABNORMAL LOW (ref 22–32)
CO2: 16 mmol/L — ABNORMAL LOW (ref 22–32)
CO2: 19 mmol/L — ABNORMAL LOW (ref 22–32)
Calcium: 9.3 mg/dL (ref 8.9–10.3)
Calcium: 9.3 mg/dL (ref 8.9–10.3)
Calcium: 9.3 mg/dL (ref 8.9–10.3)
Calcium: 9.3 mg/dL (ref 8.9–10.3)
Chloride: 106 mmol/L (ref 98–111)
Chloride: 106 mmol/L (ref 98–111)
Chloride: 105 mmol/L (ref 98–111)
Chloride: 106 mmol/L (ref 98–111)
Creatinine, Ser: 0.6 mg/dL (ref 0.44–1.00)
Creatinine, Ser: 0.64 mg/dL (ref 0.44–1.00)
Creatinine, Ser: 0.57 mg/dL (ref 0.44–1.00)
Creatinine, Ser: 0.61 mg/dL (ref 0.44–1.00)
GFR, Estimated: >60 mL/min (ref >60)
GFR, Estimated: >60 mL/min (ref >60)
GFR, Estimated: >60 mL/min (ref >60)
GFR, Estimated: >60 mL/min (ref >60)
Glucose, Bld: 151 mg/dL — ABNORMAL HIGH (ref 70–99)
Glucose, Bld: 161 mg/dL — ABNORMAL HIGH (ref 70–99)
Glucose, Bld: 141 mg/dL — ABNORMAL HIGH (ref 70–99)
Glucose, Bld: 156 mg/dL — ABNORMAL HIGH (ref 70–99)
Potassium: 4.1 mmol/L (ref 3.5–5.1)
Potassium: 3.6 mmol/L (ref 3.5–5.1)
Potassium: 3.4 mmol/L — ABNORMAL LOW (ref 3.5–5.1)
Potassium: 3.8 mmol/L (ref 3.5–5.1)
Sodium: 140 mmol/L (ref 135–145)
Sodium: 140 mmol/L (ref 135–145)
Sodium: 138 mmol/L (ref 135–145)
Sodium: 139 mmol/L (ref 135–145)

## 2024-08-18 LAB — BLOOD GAS, VENOUS
Acid-base deficit: 20.6 mmol/L — ABNORMAL HIGH (ref 0.0–2.0)
Acid-base deficit: 8.1 mmol/L — ABNORMAL HIGH (ref 0.0–2.0)
Bicarbonate: 7.5 mmol/L — ABNORMAL LOW (ref 20.0–28.0)
Bicarbonate: 17 mmol/L — ABNORMAL LOW (ref 20.0–28.0)
O2 Saturation: 97.2 %
O2 Saturation: 95.2 %
Patient temperature: 37
Patient temperature: 37
pCO2, Ven: 24 mmHg — ABNORMAL LOW (ref 44–60)
pCO2, Ven: 33 mmHg — ABNORMAL LOW (ref 44–60)
pH, Ven: 7.1 — CL (ref 7.25–7.43)
pH, Ven: 7.32 (ref 7.25–7.43)
pO2, Ven: 95 mmHg — ABNORMAL HIGH (ref 32–45)
pO2, Ven: 79 mmHg — ABNORMAL HIGH (ref 32–45)

## 2024-08-18 LAB — HEMOGLOBIN A1C
Hgb A1c MFr Bld: 12.4 % — ABNORMAL HIGH (ref 4.8–5.6)
Mean Plasma Glucose: 309.18 mg/dL

## 2024-08-18 LAB — CBC
HCT: 33.6 % — ABNORMAL LOW (ref 36.0–46.0)
Hemoglobin: 9.8 g/dL — ABNORMAL LOW (ref 12.0–15.0)
MCH: 19.1 pg — ABNORMAL LOW (ref 26.0–34.0)
MCHC: 29.2 g/dL — ABNORMAL LOW (ref 30.0–36.0)
MCV: 65.6 fL — ABNORMAL LOW (ref 80.0–100.0)
Platelets: 501 K/uL — ABNORMAL HIGH (ref 150–400)
RBC: 5.12 MIL/uL — ABNORMAL HIGH (ref 3.87–5.11)
RDW: 19 % — ABNORMAL HIGH (ref 11.5–15.5)
WBC: 12.6 K/uL — ABNORMAL HIGH (ref 4.0–10.5)
nRBC: 0 % (ref 0.0–0.2)

## 2024-08-18 LAB — LACTIC ACID, PLASMA: Lactic Acid, Venous: 2.4 mmol/L (ref 0.5–1.9)

## 2024-08-18 MED ORDER — INSULIN ASPART 100 UNIT/ML IJ SOLN: 0.0000 [IU] | Freq: Three times a day (TID) | INTRAMUSCULAR | Status: DC

## 2024-08-18 MED ORDER — INSULIN ASPART 100 UNIT/ML IJ SOLN
0.0000 [IU] | Freq: Three times a day (TID) | INTRAMUSCULAR | Status: DC
  Administered 2024-08-18: 4 [IU] via SUBCUTANEOUS
  Filled 2024-08-18: qty 4

## 2024-08-18 MED ORDER — SODIUM CHLORIDE 0.9 % IV SOLN
25.0000 mg | Freq: Once | INTRAVENOUS | Status: AC
  Administered 2024-08-18: 25 mg via INTRAVENOUS
  Filled 2024-08-18: qty 1

## 2024-08-18 MED ORDER — POTASSIUM CHLORIDE CRYS ER 20 MEQ PO TBCR
40.0000 meq | EXTENDED_RELEASE_TABLET | Freq: Once | ORAL | Status: AC
  Administered 2024-08-18: 40 meq via ORAL
  Filled 2024-08-18: qty 2

## 2024-08-18 MED ORDER — TRESIBA FLEXTOUCH 100 UNIT/ML ~~LOC~~ SOPN: 64.0000 [IU] | PEN_INJECTOR | Freq: Every day | SUBCUTANEOUS | Status: AC

## 2024-08-18 MED ORDER — INSULIN GLARGINE 100 UNIT/ML ~~LOC~~ SOLN
30.0000 [IU] | Freq: Once | SUBCUTANEOUS | Status: AC
  Administered 2024-08-18: 30 [IU] via SUBCUTANEOUS
  Filled 2024-08-18: qty 0.3

## 2024-08-18 NOTE — ED Notes (Signed)
CBG 153 

## 2024-08-18 NOTE — Discharge Summary (Addendum)
 Physician Discharge Summary   Patient: Krista Gill MRN: [id number]  DOB: 16-Dec-2000   Admit:     Date of Admission: 08/17/2024 Admitted from: home   Discharge: Date of discharge: 08/18/24 Disposition: Home Condition at discharge: good  CODE STATUS: FULL CODE     Discharge Physician: Laneta Blunt, DO [facility]     PCP: Pcp, No  Recommendations for Outpatient Follow-up:  Follow up with PCP in 1-2 weeks    Discharge Instructions     Diet Carb Modified   Complete by: As directed    Increase activity slowly   Complete by: As directed          Discharge Diagnoses: Principal Problem:   DKA (diabetic ketoacidosis) (HCC) Active Problems:   Type 1 diabetes mellitus with ketoacidosis, uncontrolled (HCC)   Marijuana use   Leukocytosis   Elevated lactic acid level   Abdominal pain       Hospital Course: Hospital course / significant events:   Krista Gill is a 23 y.o. female with medical history significant of  type 1 diabetes, sickle cell trait, DKA, marijuana abuse, medication noncompliance in  the past, who presents with nausea, dry heaves, lower abdominal pain.   HPI: symptoms started last night, evening 12/08 including nausea, dry heaves, lower abdominal pain.  No diarrhea, fever or chills.  Her lower abdominal pain is mild, aching, nonradiating, constant. She is taking NovoLog  and Tresiba  64 units daily - took her Tresiba  this morning.   12/09: to ED. pt was found to have WBC 15.2, lactic acid 2.6, DKA (bicarbonate <7.0, positive ketones 80 in the urine, beta hydroxybutyric acid 7.18, blood sugar 398 by BMP), positive UDS for tetrahydrocannabinol, GFR> 60.  Temperature normal, blood pressure 136/103, heart rate of 121, RR 18, oxygen saturation 98% on room air.  Patient is admitted to stepdown on DKA protocol 12/10: DKA parameters improving but AG still high as of this morning. Wean off insulin  drip this afternoon. Pt tolerating  diet and requesting for discharge. I recommended another night but if she prefers to leave then I recommended lower long acting dose this evening since she's already had 30 units, resume home sliding scale and return to ED if worse or any other concerns.      Consultants:  none  Procedures/Surgeries: none      ASSESSMENT & PLAN:   DKA (diabetic ketoacidosis) - resolved Recent A1c on file 12/2023 was 11.9, overall diabetes is poorly controlled Precipitated by nausea/poor po intake --> dehydration  Resume home meds, see hospital course Pt has capacity to make her own decisions, has understanding of risks / benefits / choices / consequences. She is requesting for discharge despite recommendations to continue another night here. I am comfortable with discharge in shared decision making w/ the patient and w/ instructions on close follow up, ER return precautions, med adjustments as below    Type 1 diabetes mellitus with ketoacidosis, uncontrolled As above    Marijuana use S/p counseling about importance of quitting marijuana use   Leukocytosis - improved  Likely inflammatory due to DKA Monitor CBC   Elevated lactic acid level Lactic acid 2.6, no source of infection, possibly due to dehydration. IV fluid as above   Abdominal pain:  has very mild lower abdominal tenderness, no acute abdomen on examination.  Possibly due to DKA. As needed Tylenol         overweight based on BMI: Body mass index is 26.5 kg/m.SABRA Significantly low or  high BMI is associated with higher medical risk.  Underweight - under 18  overweight - 25 to 29 obese - 30 or more Class 1 obesity: BMI of 30.0 to 34 Class 2 obesity: BMI of 35.0 to 39 Class 3 obesity: BMI of 40.0 to 49 Super Morbid Obesity: BMI 50-59 Super-super Morbid Obesity: BMI 60+ Healthy nutrition and physical activity advised as adjunct to other disease management and risk reduction treatments            Discharge  Instructions  Allergies as of 08/18/2024       Reactions   Pineapple Other (See Comments)   Itchy tongue/throat        Medication List     STOP taking these medications    potassium chloride  SA 20 MEQ tablet Commonly known as: KLOR-CON  M       TAKE these medications    insulin  aspart 100 UNIT/ML FlexPen Commonly known as: NOVOLOG  Inject 11 Units into the skin 3 (three) times daily with meals. Sliding Scale +1 for every 15 over   Tresiba  FlexTouch 100 UNIT/ML FlexTouch Pen Generic drug: insulin  degludec Inject 64 Units into the skin daily. This evening (08/18/24) take 30 units, has already had 30 units this afternoon. Can resume 64 units tomorrow (08/19/24) as usual What changed: additional instructions          Allergies  Allergen Reactions   Pineapple Other (See Comments)    Itchy tongue/throat     Subjective: pt feeling improved this morning, I spoke to her again this evening prior to discharge, tolerating diet, no nausea, requesting for dscharge    Discharge Exam: BP 95/64 (BP Location: Right Arm)   Pulse 85   Temp 98.6 F (37 C) (Oral)   Resp 14   Wt 59.5 kg   LMP 07/27/2024 (Approximate)   SpO2 100%   BMI 26.50 kg/m  General: Pt is alert, awake, not in acute distress Cardiovascular: RRR, S1/S2 +, no rubs, no gallops Respiratory: CTA bilaterally, no wheezing, no rhonchi Abdominal: Soft, NT, ND, bowel sounds + Extremities: no edema, no cyanosis     The results of significant diagnostics from this hospitalization (including imaging, microbiology, ancillary and laboratory) are listed below for reference.     Microbiology: No results found for this or any previous visit (from the past 240 hours).   Labs: BNP (last 3 results) No results for input(s): BNP in the last 8760 hours. Basic Metabolic Panel: Recent Labs  Lab 08/17/24 1820 08/18/24 0211 08/18/24 0617 08/18/24 1412 08/18/24 1426  NA 137 140 140 139 138  K 4.6 4.1 3.6 3.8  3.4*  CL 98 106 106 106 105  CO2 <7* 11* 14* 16* 19*  GLUCOSE 398* 151* 161* 141* 156*  BUN 14 10 9 8 8   CREATININE 0.87 0.60 0.64 0.61 0.57  CALCIUM 10.2 9.3 9.3 9.3 9.3   Liver Function Tests: Recent Labs  Lab 08/17/24 1820  AST 34  ALT 27  ALKPHOS 134*  BILITOT 0.4  PROT 9.9*  ALBUMIN 5.1*   Recent Labs  Lab 08/17/24 1820  LIPASE 11   No results for input(s): AMMONIA in the last 168 hours. CBC: Recent Labs  Lab 08/17/24 1820 08/18/24 0211  WBC 15.2* 12.6*  HGB 11.3* 9.8*  HCT 42.7 33.6*  MCV 70.8* 65.6*  PLT 677* 501*   Cardiac Enzymes: No results for input(s): CKTOTAL, CKMB, CKMBINDEX, TROPONINI in the last 168 hours. BNP: Invalid input(s): POCBNP CBG: Recent Labs  Lab 08/18/24  1057 08/18/24 1314 08/18/24 1535 08/18/24 1701 08/18/24 1747  GLUCAP 162* 145* 155* 153* 126*   D-Dimer No results for input(s): DDIMER in the last 72 hours. Hgb A1c No results for input(s): HGBA1C in the last 72 hours. Lipid Profile No results for input(s): CHOL, HDL, LDLCALC, TRIG, CHOLHDL, LDLDIRECT in the last 72 hours. Thyroid function studies No results for input(s): TSH, T4TOTAL, T3FREE, THYROIDAB in the last 72 hours.  Invalid input(s): FREET3 Anemia work up No results for input(s): VITAMINB12, FOLATE, FERRITIN, TIBC, IRON, RETICCTPCT in the last 72 hours. Urinalysis    Component Value Date/Time   COLORURINE STRAW (A) 08/17/2024 1816   APPEARANCEUR CLEAR (A) 08/17/2024 1816   APPEARANCEUR Hazy 12/13/2011 0800   LABSPEC 1.017 08/17/2024 1816   LABSPEC 1.021 12/13/2011 0800   PHURINE 5.0 08/17/2024 1816   GLUCOSEU >=500 (A) 08/17/2024 1816   GLUCOSEU >=500 12/13/2011 0800   HGBUR NEGATIVE 08/17/2024 1816   BILIRUBINUR NEGATIVE 08/17/2024 1816   BILIRUBINUR Negative 12/13/2011 0800   KETONESUR 80 (A) 08/17/2024 1816   PROTEINUR 100 (A) 08/17/2024 1816   NITRITE NEGATIVE 08/17/2024 1816   LEUKOCYTESUR  NEGATIVE 08/17/2024 1816   LEUKOCYTESUR Negative 12/13/2011 0800   Sepsis Labs Recent Labs  Lab 08/17/24 1820 08/18/24 0211  WBC 15.2* 12.6*   Microbiology No results found for this or any previous visit (from the past 240 hours). Imaging No results found.    Time coordinating discharge: over 30 minutes  SIGNED:  Ravyn Nikkel DO [facility]

## 2024-08-18 NOTE — ED Notes (Signed)
 RN Katheren to adjust endo tool shortly

## 2024-08-18 NOTE — Hospital Course (Addendum)
 Hospital course / significant events:   Krista Gill is a 23 y.o. female with medical history significant of  type 1 diabetes, sickle cell trait, DKA, marijuana abuse, medication noncompliance in  the past, who presents with nausea, dry heaves, lower abdominal pain.   HPI: symptoms started last night, evening 12/08 including nausea, dry heaves, lower abdominal pain.  No diarrhea, fever or chills.  Her lower abdominal pain is mild, aching, nonradiating, constant. She is taking NovoLog  and Tresiba  64 units daily - took her Tresiba  this morning.   12/09: to ED. pt was found to have WBC 15.2, lactic acid 2.6, DKA (bicarbonate <7.0, positive ketones 80 in the urine, beta hydroxybutyric acid 7.18, blood sugar 398 by BMP), positive UDS for tetrahydrocannabinol, GFR> 60.  Temperature normal, blood pressure 136/103, heart rate of 121, RR 18, oxygen saturation 98% on room air.  Patient is admitted to stepdown on DKA protocol 12/10: DKA parameters improving but AG still high as of this morning. Wean off insulin  drip this afternoon. Pt tolerating diet and requesting for discharge. I recommended another night but if she prefers to leave then I recommended lower long acting dose this evening since she's already had 30 units, resume home sliding scale and return to ED if worse or any other concerns.      Consultants:  none  Procedures/Surgeries: none      ASSESSMENT & PLAN:   DKA (diabetic ketoacidosis) - resolved Recent A1c on file 12/2023 was 11.9, overall diabetes is poorly controlled Precipitated by nausea/poor po intake --> dehydration  Resume home meds, see hospital course   Type 1 diabetes mellitus with ketoacidosis, uncontrolled As above    Marijuana use S/p counseling about importance of quitting marijuana use   Leukocytosis - improved  Likely inflammatory due to DKA Monitor CBC   Elevated lactic acid level Lactic acid 2.6, no source of infection, possibly due to  dehydration. IV fluid as above   Abdominal pain:  has very mild lower abdominal tenderness, no acute abdomen on examination.  Possibly due to DKA. As needed Tylenol         overweight based on BMI: Body mass index is 26.5 kg/m.SABRA Significantly low or high BMI is associated with higher medical risk.  Underweight - under 18  overweight - 25 to 29 obese - 30 or more Class 1 obesity: BMI of 30.0 to 34 Class 2 obesity: BMI of 35.0 to 39 Class 3 obesity: BMI of 40.0 to 49 Super Morbid Obesity: BMI 50-59 Super-super Morbid Obesity: BMI 60+ Healthy nutrition and physical activity advised as adjunct to other disease management and risk reduction treatments    DVT prophylaxis: *** IV fluids: *** continuous IV fluids  Nutrition: *** Central lines / other devices: ***  Code Status: *** ACP documentation reviewed: *** none on file in [facility]  TOC needs: *** Medical barriers to dispo: ***. Expected medical readiness for discharge ***.

## 2024-08-18 NOTE — ED Notes (Signed)
 Pt given crackers while she waits on her meal. Dietary advised meal would arrive around 6pm.

## 2024-08-18 NOTE — ED Notes (Signed)
 Per Dr. Marsa, okay to DC the IV insulin  at this time

## 2024-08-18 NOTE — Inpatient Diabetes Management (Addendum)
 Inpatient Diabetes Program Recommendations  AACE/ADA: New Consensus Statement on Inpatient Glycemic Control  Target Ranges:  Prepandial:   less than 140 mg/dL      Peak postprandial:   less than 180 mg/dL (1-2 hours)      Critically ill patients:  140 - 180 mg/dL    Latest Reference Range & Units 08/18/24 00:59 08/18/24 02:08 08/18/24 03:02 08/18/24 04:31 08/18/24 05:28 08/18/24 06:27 08/18/24 08:37  Glucose-Capillary 70 - 99 mg/dL 869 (H) 856 (H) 821 (H) 165 (H) 167 (H) 154 (H) 153 (H)    Latest Reference Range & Units 08/17/24 18:20 08/18/24 02:11 08/18/24 06:17  CO2 22 - 32 mmol/L <7 (L) 11 (L) 14 (L)  Glucose 70 - 99 mg/dL 601 (H) 848 (H) 838 (H)  Anion gap 5 - 15  NOT CALCULATED 23 (H) 19 (H)    Latest Reference Range & Units 08/17/24 22:03  Beta-Hydroxybutyric Acid 0.05 - 0.27 mmol/L 7.18 (H)    Latest Reference Range & Units 08/17/24 20:38 08/17/24 22:03  Lactic Acid, Venous 0.5 - 1.9 mmol/L 2.6 (HH) 2.4 (HH)   Review of Glycemic Control  Diabetes history: DM1 Outpatient Diabetes medications: Tresiba  64 units daily, Novolog  11 units TID with meals plus correction Current orders for Inpatient glycemic control: IV insulin   Inpatient Diabetes Program Recommendations:    Insulin : IV insulin  should be continued until acidosis has completely cleared.    Outpatient DM: At time of discharge, please provide Rx for Novolog  Flexpens [phone]).  NOTE: Patient has Type 1 DM and uses Tresbia and Novolog  outpatient for DM management. Per chart, patient sees [facility] and was last seen on 12/29/23 by Dr. Betha. Per office note on 12/29/23 patient was advised to increase Tresiba  to 64 units daily, Novolog  11-19 units TID with meals (for meal coverage and correction). Per H&P on 08/17/24, patient reports she has been consistently taking Tresiba  and Novolog ; reported taking Tresiba  on the morning of 08/17/24.   Addendum 08/18/24@12 :20-Spoke to patient at bedside in ED regarding DM. Patient  confirms that she has DM1 and she takes Tresiba  64 units daily, Novolog  11 units with meals plus correction. Patient does finger sticks for glucose monitoring and she reports that she has all DM medications and supplies but notes she only has 2 Novolog  insulin  pens left and requested refill for Novolog  pens at discharge. Patient states that she is planning to try to call Endocrinology office so she an get an appointment for follow up. Patient states that she is consistently taking insulin  as prescribed, she rotates injection sites, and she avoids areas with hardened knots.  Patient reports that she is considering using an insulin  pump and plans to discuss with Endocrinology at next appointment. Stressed importance of DM control to prevent complications from uncontrolled DM. Patient verbalized understanding of information discussed and has no questions at this time.  Thanks, Earnie Gainer, RN, MSN, CDCES Diabetes Coordinator Inpatient Diabetes Program [phone] (Team Pager from 8am to 5pm)

## 2024-08-30 ENCOUNTER — Emergency Department
Admission: EM | Admit: 2024-08-30 | Discharge: 2024-08-30 | Disposition: A | Discharge status: Home / Self Care | Attending: Emergency Medicine | Admitting: Emergency Medicine

## 2024-08-30 ENCOUNTER — Other Ambulatory Visit: Payer: Self-pay

## 2024-08-30 DIAGNOSIS — E109 Type 1 diabetes mellitus without complications: Secondary | ICD-10-CM | POA: Diagnosis not present

## 2024-08-30 DIAGNOSIS — L292 Pruritus vulvae: Secondary | ICD-10-CM | POA: Diagnosis present

## 2024-08-30 DIAGNOSIS — N7689 Other specified inflammation of vagina and vulva: Secondary | ICD-10-CM | POA: Insufficient documentation

## 2024-08-30 DIAGNOSIS — N9089 Other specified noninflammatory disorders of vulva and perineum: Secondary | ICD-10-CM

## 2024-08-30 LAB — CHLAMYDIA/NGC RT PCR (ARMC ONLY)
Chlamydia Tr: NOT DETECTED
N gonorrhoeae: NOT DETECTED

## 2024-08-30 LAB — URINALYSIS, ROUTINE W REFLEX MICROSCOPIC
Bilirubin Urine: NEGATIVE
Glucose, UA: >=500 mg/dL — AB
Hgb urine dipstick: NEGATIVE
Ketones, ur: 20 mg/dL — AB
Nitrite: NEGATIVE
Protein, ur: NEGATIVE mg/dL
Specific Gravity, Urine: 1.025 (ref 1.005–1.030)
pH: 7 (ref 5.0–8.0)

## 2024-08-30 LAB — WET PREP, GENITAL
Clue Cells Wet Prep HPF POC: NONE SEEN
Sperm: NONE SEEN
Trich, Wet Prep: NONE SEEN
WBC, Wet Prep HPF POC: <10
Yeast Wet Prep HPF POC: NONE SEEN

## 2024-08-30 LAB — POC URINE PREG, ED: Preg Test, Ur: NEGATIVE

## 2024-08-30 MED ORDER — VALACYCLOVIR HCL 500 MG PO TABS: 500.0000 mg | ORAL_TABLET | Freq: Two times a day (BID) | ORAL | 0 refills | Status: AC

## 2024-08-30 NOTE — Discharge Instructions (Addendum)
 Use topical diaper rash cream to help reduce any vaginal irritation.  You should also consider using air conditioner with shaving activities with a clean fresh razor every week.  Follow-up with your primary provider or the [facility] department as needed.

## 2024-08-30 NOTE — ED Triage Notes (Signed)
 Pt reports discomfort to vagina since this morning, pt repots symptoms similar to when she has had a yeast infection in the past. Pt denies dysuria

## 2024-08-30 NOTE — ED Provider Notes (Signed)
 "   [facility] Emergency Department Provider Note     Event Date/Time   First MD Initiated Contact with Patient 08/30/24 1951     (approximate)   History   Vaginal Itching   HPI  Krista Gill is a 23 y.o. female with a history of obesity, hidradenitis suppurativa, and DM type I, and poor medication compliance, presents to the ED endorsing vaginal discomfort.  She endorses onset this morning.  She describes irritation similar to when she had a yeast infection.  Patient denies any dysuria, hematuria, urinary retention.  No reports of any vaginal discharge or abnormal vaginal bleeding.  Patient presents to the ED endorsing some vulvar irritation.  She recalls that she previously was tested for HIV, RPR, and HSV, with all testing and negative.  Patient reports that symptoms of vulvar irritation resolved with previous treatment with antiviral medication.  Physical Exam   Triage Vital Signs: ED Triage Vitals  Encounter Vitals Group     BP 08/30/24 1905 (!) 149/96     Girls Systolic BP Percentile --      Girls Diastolic BP Percentile --      Boys Systolic BP Percentile --      Boys Diastolic BP Percentile --      Pulse Rate 08/30/24 1905 (!) 110     Resp 08/30/24 1905 16     Temp 08/30/24 1905 98.6 F (37 C)     Temp src --      SpO2 08/30/24 1905 98 %     Weight 08/30/24 1902 145 lb (65.8 kg)     Height 08/30/24 1902 4' 11 (1.499 m)     Head Circumference --      Peak Flow --      Pain Score 08/30/24 1902 3     Pain Loc --      Pain Education --      Exclude from Growth Chart --     Most recent vital signs: Vitals:   08/30/24 1905 08/30/24 2109  BP: (!) 149/96   Pulse: (!) 110 (!) 104  Resp: 16 18  Temp: 98.6 F (37 C)   SpO2: 98% 100%    General Awake, no distress.  NAD HEENT NCAT. PERRL. EOMI. No rhinorrhea. Mucous membranes are moist CV:  Good peripheral perfusion.  RESP:  Normal effort.  GYN:  Deferred. Patient-collected  swabs   ED Results / Procedures / Treatments   Labs (all labs ordered are listed, but only abnormal results are displayed) Labs Reviewed  URINALYSIS, ROUTINE W REFLEX MICROSCOPIC - Abnormal; Notable for the following components:      Result Value   Color, Urine YELLOW (*)    APPearance HAZY (*)    Glucose, UA >=500 (*)    Ketones, ur 20 (*)    Leukocytes,Ua TRACE (*)    Bacteria, UA RARE (*)    All other components within normal limits  WET PREP, GENITAL  CHLAMYDIA/NGC RT PCR ([facility] ONLY)            POC URINE PREG, ED     EKG   RADIOLOGY  No results found.   PROCEDURES:  Critical Care performed: No  Procedures   MEDICATIONS ORDERED IN ED: Medications - No data to display   IMPRESSION / MDM / ASSESSMENT AND PLAN / ED COURSE  I reviewed the triage vital signs and the nursing notes.  Differential diagnosis includes, but is not limited to, UTI, vaginitis, cervicitis, candidal yeast infection  Patient's presentation is most consistent with acute complicated illness / injury requiring diagnostic workup.  Patient's diagnosis is consistent with vulvar irritation.  Patient with symptoms that appear to be related to abrasions or fissures to the vaginal area following intercourse.  No evidence of acute UTI or vaginitis as cervicitis on exam.  No indication for repeat viral testing at this time.  We discussed treatment options including barrier creams, and other female hygiene products that would decrease irritation in the vulva.  Patient is aware that her previous viral test were negative for HSV, and is no indication at this time for repeat treatment.  At her request, patient will be discharged home with prescriptions for valacyclovir . Patient is to follow up with her OB provider as suggested, as needed or otherwise directed. Patient is given ED precautions to return to the ED for any worsening or new symptoms.     FINAL CLINICAL IMPRESSION(S) /  ED DIAGNOSES   Final diagnoses:  Vulvar irritation     Rx / DC Orders   ED Discharge Orders          Ordered    valACYclovir  (VALTREX ) 500 MG tablet  2 times daily        08/30/24 2035             Note:  This document was prepared using Dragon voice recognition software and may include unintentional dictation errors.    Loyd Candida LULLA Aldona, PA-C 08/30/24 2111    Floy Roberts, MD 08/30/24 2115  "
